# Patient Record
Sex: Male | Born: 1945
Health system: Southern US, Community
[De-identification: ages and names within clinical notes are randomized; demographics above are authoritative.]

## PROBLEM LIST (undated history)

## (undated) DIAGNOSIS — I6350 Cerebral infarction due to unspecified occlusion or stenosis of unspecified cerebral artery: Secondary | ICD-10-CM

## (undated) DIAGNOSIS — K219 Gastro-esophageal reflux disease without esophagitis: Secondary | ICD-10-CM

## (undated) DIAGNOSIS — G629 Polyneuropathy, unspecified: Secondary | ICD-10-CM

## (undated) DIAGNOSIS — D869 Sarcoidosis, unspecified: Secondary | ICD-10-CM

## (undated) DIAGNOSIS — I10 Essential (primary) hypertension: Secondary | ICD-10-CM

## (undated) DIAGNOSIS — R55 Syncope and collapse: Secondary | ICD-10-CM

## (undated) DIAGNOSIS — M1990 Unspecified osteoarthritis, unspecified site: Secondary | ICD-10-CM

## (undated) DIAGNOSIS — E785 Hyperlipidemia, unspecified: Secondary | ICD-10-CM

## (undated) DIAGNOSIS — K4090 Unilateral inguinal hernia, without obstruction or gangrene, not specified as recurrent: Secondary | ICD-10-CM

## (undated) DIAGNOSIS — Z8679 Personal history of other diseases of the circulatory system: Secondary | ICD-10-CM

## (undated) DIAGNOSIS — C4490 Unspecified malignant neoplasm of skin, unspecified: Secondary | ICD-10-CM

## (undated) HISTORY — PX: HERNIA REPAIR: SHX51

## (undated) HISTORY — PX: CATARACT EXTRACTION: SUR2

## (undated) HISTORY — PX: APPENDECTOMY: SHX54

## (undated) HISTORY — DX: Polyneuropathy, unspecified: G62.9

## (undated) HISTORY — DX: Syncope and collapse: R55

## (undated) HISTORY — DX: Hyperlipidemia, unspecified: E78.5

## (undated) HISTORY — DX: Personal history of other diseases of the circulatory system: Z86.79

## (undated) HISTORY — DX: Unilateral inguinal hernia, without obstruction or gangrene, not specified as recurrent: K40.90

## (undated) HISTORY — DX: Cerebral infarction due to unspecified occlusion or stenosis of unspecified cerebral artery: I63.50

## (undated) HISTORY — PX: COLONOSCOPY: SHX174

## (undated) HISTORY — DX: Sarcoidosis, unspecified: D86.9

## (undated) HISTORY — DX: Gastro-esophageal reflux disease without esophagitis: K21.9

## (undated) HISTORY — DX: Essential (primary) hypertension: I10

---

## 2000-01-31 ENCOUNTER — Ambulatory Visit (HOSPITAL_COMMUNITY): Admission: RE | Admit: 2000-01-31 | Discharge: 2000-01-31 | Payer: Self-pay | Admitting: Cardiology

## 2006-03-24 ENCOUNTER — Ambulatory Visit: Payer: Self-pay | Admitting: Internal Medicine

## 2006-04-07 ENCOUNTER — Ambulatory Visit: Payer: Self-pay | Admitting: Internal Medicine

## 2006-04-07 ENCOUNTER — Encounter (INDEPENDENT_AMBULATORY_CARE_PROVIDER_SITE_OTHER): Payer: Self-pay | Admitting: Specialist

## 2007-11-19 ENCOUNTER — Observation Stay (HOSPITAL_COMMUNITY): Admission: EM | Admit: 2007-11-19 | Discharge: 2007-11-21 | Payer: Self-pay | Admitting: Emergency Medicine

## 2007-11-19 ENCOUNTER — Ambulatory Visit: Payer: Self-pay | Admitting: Cardiology

## 2008-05-19 ENCOUNTER — Ambulatory Visit: Payer: Self-pay | Admitting: Internal Medicine

## 2008-05-19 DIAGNOSIS — R1032 Left lower quadrant pain: Secondary | ICD-10-CM | POA: Insufficient documentation

## 2008-05-19 DIAGNOSIS — K219 Gastro-esophageal reflux disease without esophagitis: Secondary | ICD-10-CM

## 2008-05-19 HISTORY — DX: Gastro-esophageal reflux disease without esophagitis: K21.9

## 2008-05-19 LAB — CONVERTED CEMR LAB: Creatinine, Ser: 1.1 mg/dL (ref 0.4–1.5)

## 2008-05-22 ENCOUNTER — Ambulatory Visit: Payer: Self-pay | Admitting: Internal Medicine

## 2008-05-29 DIAGNOSIS — K4090 Unilateral inguinal hernia, without obstruction or gangrene, not specified as recurrent: Secondary | ICD-10-CM

## 2008-05-29 HISTORY — DX: Unilateral inguinal hernia, without obstruction or gangrene, not specified as recurrent: K40.90

## 2008-06-02 DIAGNOSIS — R930 Abnormal findings on diagnostic imaging of skull and head, not elsewhere classified: Secondary | ICD-10-CM | POA: Insufficient documentation

## 2008-06-09 ENCOUNTER — Encounter: Admission: RE | Admit: 2008-06-09 | Discharge: 2008-06-09 | Payer: Self-pay | Admitting: General Surgery

## 2008-06-16 ENCOUNTER — Encounter (INDEPENDENT_AMBULATORY_CARE_PROVIDER_SITE_OTHER): Payer: Self-pay | Admitting: *Deleted

## 2008-07-09 ENCOUNTER — Ambulatory Visit (HOSPITAL_COMMUNITY): Admission: RE | Admit: 2008-07-09 | Discharge: 2008-07-09 | Payer: Self-pay | Admitting: General Surgery

## 2008-07-24 ENCOUNTER — Encounter: Payer: Self-pay | Admitting: Internal Medicine

## 2009-04-08 ENCOUNTER — Encounter (INDEPENDENT_AMBULATORY_CARE_PROVIDER_SITE_OTHER): Payer: Self-pay | Admitting: *Deleted

## 2009-09-18 ENCOUNTER — Telehealth: Payer: Self-pay | Admitting: Internal Medicine

## 2010-08-31 NOTE — Progress Notes (Signed)
Summary: Schedule Colonoscopy  Phone Note Outgoing Call   Call placed by: Hortense Ramal CMA Duncan Dull),  September 18, 2009 10:17 AM Call placed to: Patient Summary of Call: Patient is due for a recall colonoscopy due to his history of diverticulosis, internal hemorrhoids and adenomatous colonic polyps. I have advised patient of this and states that he does not want to have a colonosocpy at this time due to some other things going on. He states that he will call back. I have advised the patient as to the importance of having f/u colonoscopy. Patient verbalizes understanding. Initial call taken by: Hortense Ramal CMA Duncan Dull),  September 18, 2009 10:19 AM

## 2010-12-14 NOTE — H&P (Signed)
NAMEMATEUS, REWERTS                   ACCOUNT NO.:  [id number]   MEDICAL RECORD NO.:  [id number]          PATIENT TYPE:  INP   LOCATION:  6533                         FACILITY:  [facility]   PHYSICIAN:  Rollene Rotunda, MD, FACCDATE OF BIRTH:  07/28/1946   DATE OF ADMISSION:  11/19/2007  DATE OF DISCHARGE:                              HISTORY & PHYSICAL   PRIMARY:  Gaspar Garbe, MD.   REFERRING:  [facility] [address].   REASON FOR PRESENTATION:  Evaluate the patient for chest pain.   HISTORY OF PRESENT ILLNESS:  The patient is a pleasant 65 year old  gentleman with no prior cardiac history.  He does report a history a few  years ago of an abnormal EKG with a right bundle branch block.  He had a  stress perfusion study per his description.  He thinks that this was  normal.  There was no cardiology follow-up.  He has no active  cardiovascular physicians.  We were asked to see him upon transfer from  [facility].  He reports that yesterday he had chest pain.  This happened  at rest.  He just picked up his 12-pound dog.  He described as sharp  pain.  It radiated through to his back.  It was severe lasting for about  30 seconds.  It went away spontaneously.  He said that there was no  associated diaphoresis, nausea or vomiting.  He had not had this kind of  discomfort before.  The patient had a recurrence of this discomfort  today around 6:00 p.m..  It was at rest.  It was substernal and radiated  through to his back.  He felt like he had to burp and had a little  discomfort up into his throat.  It was mildly diaphoretic.  He did not  have any acute shortness of breath.  He did not have any radiation to  his shoulders or to his arms.  He was slightly nauseated but did not  throw up.  This discomfort was not as severe as yesterday but lasted a  few more minutes.  It seemed to go away spontaneously though he has had  some hints of it coming back since he has been in the Urgent Care and  in  the ER.  At Urgent Care, there were no acute EKG changes.  He did get  aspirin and was transferred here.  Of note, his blood pressure initially  was 170/100.  Blood pressure here currently are 140/90.  He does not  report out of control blood pressure though he has had hypertension for  a couple of years.   Of note, the patient took a long car trip driving back from [place]  yesterday.  He has had no leg pain or swelling.  He has been active.  He  is able to mow the lawn this weekend with a push mower.  He had not done  that in a while.  He said he did have to stop twice because of fatigue.  He said he has been more fatigued recently over the  last several weeks.  He is not describing PND or orthopnea.   PAST HISTORY:  1. Hypertension x2 years.  2. Hyperlipidemia x2 years.   PAST SURGICAL HISTORY:  Appendectomy.   ALLERGIES:  None.   MEDICATIONS:  (The patient is not sure.  He thinks he takes simvastatin  though he does not know the dose.  He takes a blood pressure medicine  but does not remember what it is).   FAMILY HISTORY:  Noncontributory for early coronary artery disease.   SOCIAL HISTORY:  The patient quit smoking greater than 20 years ago.  He  is married.  He has two children of his own and two step children.  He  works in [employer].   REVIEW OF SYSTEMS:  As stated in the HPI, positive for hemorrhoids but  no active bleeding.  Negative for all other systems.   PHYSICAL EXAMINATION:  GENERAL:  The patient is in no acute distress.  VITAL SIGNS:  Blood pressure (initially on presentation in the ER  160/98), heart rate 70, 99% saturation on 2 L, afebrile.  HEENT:  Eyes, pupils equal, round, and reactive to light, fundi not  visualized, oral mucosa unremarkable.  NECK:  No jugular distention of 45 degrees, carotid upstroke brisk and  symmetric, no bruits, no thyromegaly.  LYMPHATICS:  No cervical, axillary, or inguinal adenopathy.  LUNGS:  Clear to auscultation  bilaterally.  BACK:  No costovertebral angle tenderness.  CHEST:  Unremarkable.  HEART:  PMI not displaced or sustained, S1 and S2 within normal limits,  no S3, no S4, no clicks, no rubs, no murmurs.  ABDOMEN:  Flat, positive  bowel sounds, normal in frequency and pitch, no bruits, no rebound, no  guarding, no midline pulsatile mass, no hepatomegaly, no splenomegaly.  SKIN:  No rashes, no nodules.  EXTREMITIES:  Pulses 2+ throughout, no edema, no cyanosis or clubbing.  NEURO:  Oriented to person, place, and time, cranial nerves II through  XII grossly intact, motor is intact throughout.   LABS:  EKG, sinus rhythm, rate 70, axis within normal limits, intervals  within normal limits, no acute ST-T wave changes.   ASSESSMENT/PLAN:  1. Chest,  the patient's chest discomfort has some atypical features.      There are no acute EKG changes.  Enzymes and other labs are      pending.  Chest x-ray is pending.  He does have cardiovascular risk      factors.  At this point, I will plan to rule him out from      myocardial infarction.  I will treat him with aspirin, beta      blockers, heparin.  I will get nitroglycerin if he has recurrent      discomfort.  If he rules out and his EKG in the morning is      unremarkable,  I will plan an inpatient exercise Cardiolite.      Because he took a long car trip, I will check a D-dimer.  If this      is positive, he will need a spiral CT.  If the above workup is      negative, I might suggest gastrointestinal evaluation.  2. Hypertension.  Blood pressure was elevated at Urgent Care and here.      I plan on using a beta blocker.  His wife will bring his home      medications and we will probably restart those prior to discharge.  3. Risk reduction.  We will  check a lipid profile.  I will check a      TSH.  He will be counseled on weight.      Rollene Rotunda, MD, [facility]  Electronically Signed     JH/MEDQ  D:  11/19/2007  T:  11/20/2007  Job:   [id number]   cc:   Gaspar Garbe, M.D.

## 2010-12-14 NOTE — Op Note (Signed)
[name], [name]                ACCOUNT NO.:  [id number]   MEDICAL RECORD NO.:  [id number]          PATIENT TYPE:  AMB   LOCATION:  DAY                          FACILITY:  [facility]   PHYSICIAN:  Sharlet Salina T. Hoxworth, M.D.DATE OF BIRTH:  Oct 13, 1945   DATE OF PROCEDURE:  07/09/2008  DATE OF DISCHARGE:                               OPERATIVE REPORT   PREOPERATIVE DIAGNOSIS:  Left inguinal hernia.   POSTOPERATIVE DIAGNOSIS:  Left inguinal hernia.   SURGICAL PROCEDURES:  Laparoscopic repair of left inguinal hernia.   SURGEON:  Lorne Skeens. Hoxworth, M.D.   ANESTHESIA:  General.   BRIEF HISTORY:  [name] is a 65 year old male who presents with a very  symptomatic left inguinal hernia confirmed on exam and on CT scan after  an episode abdominal pain and has been shown to contain sigmoid colon.  I have recommended repair.  We discussed options including open  laparoscopic repair and have decided to proceed with laparoscopic  repair.  The nature of the procedure, its indications and risks of  bleeding, infection, recurrence, anesthetic complications and rare risks  of bladder or visceral injury were discussed and understood.  He is wow  brought to operating room for this procedure.   DESCRIPTION OF OPERATION:  The patient brought to the operating room,  placed in supine position on the operating table and general orotracheal  anesthesia was induced.  The abdomen was widely sterilely prepped and  draped.  Foley catheter was placed.  He received preoperative  antibiotics.  PAS were in place.  Correct patient and procedure were  verified.  Local anesthesia was used to infiltrate the trocar sites.  A  1 cm incision was made at the umbilicus and dissection carried down to  the anterior fascia.  This was incised transversely just to the left the  midline and the medial edge of the left rectus muscle identified and  retracted laterally and the preperitoneal space entered under direct  vision.  The balloon catheter was then passed with its tip into this  space and then down along the midline to the pubic symphysis.  Under  laparoscopic vision, the balloon was inflated.  There was preferential  inflation over to the left side.  It was left inflated for several  minutes for hemostasis and then the balloon desufflated and removed and  CO2 pressure applied via the balloon trocar.  Laparoscopy showed  excellent dissection of the left, minimal dissection over to the right  side just beyond the midline.  The patient had no clinical evidence of  hernia on the right and I therefore just dissected the fascia and  peritoneum somewhat to the right of the midline to allow trocar  placement.  Two 5 mm trocars were then placed, one just beneath the  camera in the midline, another just over to the right of the midline  between the pubis and the umbilicus.  Again there had been excellent  dissection on the left side.  The pubic symphysis was identified and  Cooper's ligament cleared a little bit further down to the iliac vessels  which  were carefully identified and protected.  There was no direct  hernia.  The epigastric vessels were identified.  Immediately apparent  was a good-sized hernia sac and indirect hernia lateral to the  epigastric vessels.  There had been complete dissection of the  peritoneum away from the anterior abdominal wall laterally out to the  anterior superior iliac spine up to the level of the umbilicus.  The sac  was then dissected up near the internal ring and completely separated  from cord structures and a good-sized hernia sac and large cord lipoma  were reduced from the internal ring.  The peritoneum was then stripped  back well posterior all along the cord and was already dissected out  laterally.  An extra large Bard 3-D left-sided piece of mesh was then  inserted, unfurled and oriented.  It was tacked initially at the pubic  symphysis and then the lateral  corner was seen to go well out toward the  lateral abdominal wall and beginning along the superior edge where I  could feel the tacks through the anterior abdominal wall, the superior  edge was tacked back working medially avoiding the epigastric vessels  and finally the medial edge of the mesh was tacked just beyond the mid  midline and a couple of tacks placed in Cooper's ligament as well.  This  appeared to provide nice broad coverage of the direct and indirect  spaces.  The large hernia sac was then brought deep to the mesh and  tacked up to the abdominal wall again being able to feel the tack  through the abdominal wall.  The operative site was inspected for  hemostasis which appeared complete.  Trocars were removed and all CO2  evacuated.  The small fascial defect at the umbilicus was repaired with  a figure-of-eight suture of 0 Vicryl.  Skin was closed with subcuticular  Monocryl and Dermabond.  Sponge, needle and instrument counts correct.  The patient was taken to recovery in good condition.      Lorne Skeens. Hoxworth, M.D.  Electronically Signed     BTH/MEDQ  D:  07/09/2008  T:  07/10/2008  Job:  [id number]

## 2010-12-14 NOTE — Discharge Summary (Signed)
NAMEMOHANNAD, OLIVERO                   ACCOUNT NO.:  [id number]   MEDICAL RECORD NO.:  [id number]          PATIENT TYPE:  INP   LOCATION:  6533                         FACILITY:  [facility]   PHYSICIAN:  Veverly Fells. Excell Seltzer, MD  DATE OF BIRTH:  1945-09-03   DATE OF ADMISSION:  11/19/2007  DATE OF DISCHARGE:  11/21/2007                               DISCHARGE SUMMARY   PRIMARY CARDIOLOGIST:  Rollene Rotunda, MD, [facility].   PRIMARY CARE Gene Colee:  Gaspar Garbe, M.D.   DISCHARGE DIAGNOSIS:  Chest pain.   SECONDARY DIAGNOSES:  1. Hypertension/hypertensive urgency.  2. Hyperlipidemia.  3. Mediastinal adenopathy with multiple lung nodules noted on chest CT      this admission with recommended followup in 6 months.  4. Status post appendectomy.   ALLERGIES:  No known drug allergies.   PROCEDURES:  CT angio of the chest and Lexiscan Myoview showing an EF of  59% with normal LV wall motion, mild diaphragmatic attenuation, and  minimal nonreversible diminished myocardial perfusion at anterolateral  aspect of the left ventricular apex.  Overall felt to be a low-risk  study.   HISTORY OF PRESENT ILLNESS:  A 65 year old male with prior history of  hypertension and hyperlipidemia, who was in his usual state of health  until 1 day prior to admission when he had sudden onset of sharp chest  discomfort after picking up his dog radiating through to his back and  lasting for about 30 seconds.  Symptoms resolved spontaneously.  On  November 19, 2007, at approximately 6:00 p.m., he had recurrence of  symptoms while at rest associated with discomfort in his throat and a  sensation that he had to belch.  He presented to [facility], where ECG  showed no acute changes.  His blood pressure was elevated at Urgent Care  at 170/100.  He was transferred to the [facility] ED for further  evaluation.  Upon arrival, his blood pressure was 140/90.  The patient  had no additional chest discomfort, and he was admitted for  further  evaluation.   HOSPITAL COURSE:  The patient ruled out for MI.  His D-dimer was  elevated at 0.97, and the patient did report a recent long trip from [place].  This being the case, a CT of the chest was performed, and  although it did not show pulmonary embolism he was found to have  mediastinal adenopathy and multiple lung nodules with recommendation for  a followup CT in approximately 6 months.  From a cardiac standpoint, Mr.  Puryear had no additional chest discomfort, and his blood pressure did  improve with resumption of his home dose of benazepril as well as the  addition of Norvasc.  The patient was planned to undergo stress testing  on November 20, 2007, however, the blood pressure was elevated that  morning, and he was subsequently rescheduled for a [employer],  which took place this morning.  As noted above, Myoview imaging revealed  a normal LV function with mild diaphragmatic attenuation, and overall it  was felt to be a low-risk  study.  Mr. Rufus will be discharged home  today in good condition.   Mr. Kuch TSH was found to be high at 6.4 during this admission.  We  have recommended reevaluation of his TSH and free T4 in the outpatient  setting, and we have advised that he follow up with Dr. Wylene Simmer in the  next 1-2 weeks.   DISCHARGE LABS:  Hemoglobin 14.5, hematocrit 42.4, WBC 6.9, platelets  174,000, D-dimer 0.97.  Sodium 139, potassium 3.8, chloride 107, CO2 of  24, BUN 24, creatinine 1.15, glucose 97, total bilirubin 0.8, alkaline  phosphatase 66, AST 21, ALT 20, total protein 6.4, albumin 3.4, calcium  8.8, CK 83, MB 0.8, troponin-I 0.01, total cholesterol 172,  triglycerides 69, HDL 51, LDL 107, TSH 6.444.   DISPOSITION:  The patient is being discharged home today in good  condition.   FOLLOWUP PLANS AND APPOINTMENTS:  The patient is asked to follow up with  Dr. Wylene Simmer in the next 1-2 weeks for further evaluation of thyroid  function.  The  patient will also require repeat CT of the chest to  evaluate lung nodules and mediastinal lymphadenopathy.   DISCHARGE MEDICATIONS:  1. Aspirin 81 mg daily.  2. Benazepril 20 mg daily.  3. Norvasc 5 mg daily.  4. Simvastatin 80 mg half tablet nightly.  5. Temazepam 15 mg q.h.s. p.r.n.   OUTSTANDING LABORATORY STUDIES:  None.   DURATION OF DISCHARGE/ENCOUNTER:  40 minutes including physician time.      Nicolasa Ducking, ANP      Veverly Fells. Excell Seltzer, MD  Electronically Signed    CB/MEDQ  D:  11/21/2007  T:  11/22/2007  Job:  [id number]   cc:   Gaspar Garbe, M.D.

## 2011-04-26 LAB — DIFFERENTIAL
Basophils Absolute: 0
Basophils Relative: 1
Eosinophils Absolute: 0.4
Eosinophils Absolute: 0.4
Eosinophils Relative: 6 — ABNORMAL HIGH
Lymphocytes Relative: 21
Lymphs Abs: 1.6
Lymphs Abs: 1.6
Monocytes Absolute: 0.9
Monocytes Absolute: 0.9
Monocytes Relative: 11
Monocytes Relative: 12
Neutro Abs: 4.8
Neutrophils Relative %: 61
Neutrophils Relative %: 62

## 2011-04-26 LAB — CBC
HCT: 42.4
HCT: 42.4
Hemoglobin: 14.3
Hemoglobin: 14.3
MCHC: 33.8
MCHC: 33.8
MCV: 93
MCV: 93.1
MCV: 93.5
MCV: 93.7
Platelets: 174
Platelets: 186
Platelets: 198
Platelets: 203
RBC: 4.54
RBC: 4.56
RDW: 13
RDW: 13
RDW: 13.2
WBC: 6.9
WBC: 7.7
WBC: 7.7

## 2011-04-26 LAB — LIPID PANEL
Cholesterol: 172
HDL: 51
Triglycerides: 69

## 2011-04-26 LAB — HEPATIC FUNCTION PANEL
ALT: 20
AST: 21
Albumin: 3.4 — ABNORMAL LOW
Alkaline Phosphatase: 66
Bilirubin, Direct: <0.1
Total Bilirubin: 0.8
Total Protein: 6.4

## 2011-04-26 LAB — TSH: TSH: 6.444 — ABNORMAL HIGH

## 2011-04-26 LAB — POCT CARDIAC MARKERS
Operator id: 151321
Troponin i, poc: <0.05

## 2011-04-26 LAB — HEPARIN LEVEL (UNFRACTIONATED)
Heparin Unfractionated: 0.83 — ABNORMAL HIGH
Heparin Unfractionated: 0.98 — ABNORMAL HIGH
Heparin Unfractionated: 1.19 — ABNORMAL HIGH

## 2011-04-26 LAB — CARDIAC PANEL(CRET KIN+CKTOT+MB+TROPI)
CK, MB: 1.2
Relative Index: INVALID
Total CK: 83

## 2011-04-26 LAB — CK TOTAL AND CKMB (NOT AT ARMC): CK, MB: 1.3

## 2011-04-26 LAB — BASIC METABOLIC PANEL
BUN: 24 — ABNORMAL HIGH
CO2: 24
Calcium: 8.8
Chloride: 107
Creatinine, Ser: 1.15
GFR calc Af Amer: >60
GFR calc non Af Amer: >60
Glucose, Bld: 97
Potassium: 3.8
Sodium: 139

## 2011-04-26 LAB — PROTIME-INR
INR: 0.9
Prothrombin Time: 12.8

## 2011-05-06 LAB — DIFFERENTIAL
Eosinophils Absolute: 0.4 10*3/uL (ref 0.0–0.7)
Eosinophils Relative: 5 % (ref 0–5)
Lymphocytes Relative: 22 % (ref 12–46)
Lymphs Abs: 1.7 10*3/uL (ref 0.7–4.0)
Monocytes Relative: 10 % (ref 3–12)

## 2011-05-06 LAB — COMPREHENSIVE METABOLIC PANEL
ALT: 27 U/L (ref 0–53)
AST: 26 U/L (ref 0–37)
Albumin: 3.5 g/dL (ref 3.5–5.2)
CO2: 28 mEq/L (ref 19–32)
Calcium: 9.2 mg/dL (ref 8.4–10.5)
Creatinine, Ser: 1.1 mg/dL (ref 0.4–1.5)
GFR calc Af Amer: >60 mL/min (ref >60)
GFR calc non Af Amer: >60 mL/min (ref >60)
Sodium: 140 mEq/L (ref 135–145)
Total Protein: 6.7 g/dL (ref 6.0–8.3)

## 2011-05-06 LAB — URINALYSIS, ROUTINE W REFLEX MICROSCOPIC
Bilirubin Urine: NEGATIVE
Hgb urine dipstick: NEGATIVE
Nitrite: NEGATIVE
Protein, ur: NEGATIVE mg/dL
Specific Gravity, Urine: 1.014 (ref 1.005–1.030)
Urobilinogen, UA: 0.2 mg/dL (ref 0.0–1.0)

## 2011-05-06 LAB — CBC
MCHC: 33.7 g/dL (ref 30.0–36.0)
MCV: 92.9 fL (ref 78.0–100.0)
Platelets: 204 10*3/uL (ref 150–400)
RBC: 4.91 MIL/uL (ref 4.22–5.81)
RDW: 13.4 % (ref 11.5–15.5)

## 2012-08-17 ENCOUNTER — Other Ambulatory Visit: Payer: Self-pay | Admitting: Dermatology

## 2012-09-19 ENCOUNTER — Other Ambulatory Visit: Payer: Self-pay | Admitting: Dermatology

## 2012-09-26 ENCOUNTER — Other Ambulatory Visit: Payer: Self-pay | Admitting: Dermatology

## 2012-10-25 ENCOUNTER — Other Ambulatory Visit: Payer: Self-pay | Admitting: Dermatology

## 2014-12-31 HISTORY — PX: KNEE SURGERY: SHX244

## 2014-12-31 HISTORY — PX: SKIN CANCER EXCISION: SHX779

## 2015-04-02 ENCOUNTER — Inpatient Hospital Stay (HOSPITAL_COMMUNITY): Payer: Medicare HMO

## 2015-04-02 ENCOUNTER — Inpatient Hospital Stay (HOSPITAL_COMMUNITY)
Admission: EM | Admit: 2015-04-02 | Discharge: 2015-04-04 | DRG: 066 | Disposition: A | Discharge status: Home / Self Care | Payer: Medicare HMO | Attending: Internal Medicine | Admitting: Internal Medicine

## 2015-04-02 ENCOUNTER — Emergency Department (HOSPITAL_COMMUNITY): Payer: Medicare HMO

## 2015-04-02 ENCOUNTER — Other Ambulatory Visit (HOSPITAL_COMMUNITY): Payer: Medicare HMO

## 2015-04-02 ENCOUNTER — Encounter (HOSPITAL_COMMUNITY): Payer: Self-pay

## 2015-04-02 DIAGNOSIS — H5347 Heteronymous bilateral field defects: Secondary | ICD-10-CM | POA: Diagnosis present

## 2015-04-02 DIAGNOSIS — I639 Cerebral infarction, unspecified: Secondary | ICD-10-CM | POA: Diagnosis present

## 2015-04-02 DIAGNOSIS — H5462 Unqualified visual loss, left eye, normal vision right eye: Secondary | ICD-10-CM | POA: Diagnosis present

## 2015-04-02 DIAGNOSIS — I63431 Cerebral infarction due to embolism of right posterior cerebral artery: Principal | ICD-10-CM | POA: Diagnosis present

## 2015-04-02 DIAGNOSIS — I6350 Cerebral infarction due to unspecified occlusion or stenosis of unspecified cerebral artery: Secondary | ICD-10-CM

## 2015-04-02 DIAGNOSIS — Z7902 Long term (current) use of antithrombotics/antiplatelets: Secondary | ICD-10-CM | POA: Diagnosis not present

## 2015-04-02 DIAGNOSIS — E785 Hyperlipidemia, unspecified: Secondary | ICD-10-CM | POA: Diagnosis present

## 2015-04-02 DIAGNOSIS — I10 Essential (primary) hypertension: Secondary | ICD-10-CM | POA: Insufficient documentation

## 2015-04-02 DIAGNOSIS — G43109 Migraine with aura, not intractable, without status migrainosus: Secondary | ICD-10-CM | POA: Diagnosis present

## 2015-04-02 DIAGNOSIS — I63411 Cerebral infarction due to embolism of right middle cerebral artery: Secondary | ICD-10-CM | POA: Diagnosis present

## 2015-04-02 DIAGNOSIS — R270 Ataxia, unspecified: Secondary | ICD-10-CM | POA: Diagnosis present

## 2015-04-02 DIAGNOSIS — I6789 Other cerebrovascular disease: Secondary | ICD-10-CM | POA: Diagnosis not present

## 2015-04-02 DIAGNOSIS — Z79899 Other long term (current) drug therapy: Secondary | ICD-10-CM | POA: Diagnosis not present

## 2015-04-02 DIAGNOSIS — Z7982 Long term (current) use of aspirin: Secondary | ICD-10-CM

## 2015-04-02 DIAGNOSIS — I6340 Cerebral infarction due to embolism of unspecified cerebral artery: Secondary | ICD-10-CM | POA: Diagnosis not present

## 2015-04-02 HISTORY — DX: Cerebral infarction due to unspecified occlusion or stenosis of unspecified cerebral artery: I63.50

## 2015-04-02 HISTORY — DX: Unspecified osteoarthritis, unspecified site: M19.90

## 2015-04-02 HISTORY — DX: Unspecified malignant neoplasm of skin, unspecified: C44.90

## 2015-04-02 LAB — BASIC METABOLIC PANEL
ANION GAP: 7 (ref 5–15)
BUN: 20 mg/dL (ref 6–20)
CO2: 27 mmol/L (ref 22–32)
Calcium: 9.9 mg/dL (ref 8.9–10.3)
Chloride: 104 mmol/L (ref 101–111)
Creatinine, Ser: 1.46 mg/dL — ABNORMAL HIGH (ref 0.61–1.24)
GFR calc Af Amer: 55 mL/min — ABNORMAL LOW (ref >60)
GFR, EST NON AFRICAN AMERICAN: 47 mL/min — AB (ref >60)
GLUCOSE: 99 mg/dL (ref 65–99)
POTASSIUM: 4 mmol/L (ref 3.5–5.1)
Sodium: 138 mmol/L (ref 135–145)

## 2015-04-02 LAB — CBC WITH DIFFERENTIAL/PLATELET
BASOS ABS: 0.1 10*3/uL (ref 0.0–0.1)
Basophils Relative: 1 % (ref 0–1)
Eosinophils Absolute: 0.5 10*3/uL (ref 0.0–0.7)
Eosinophils Relative: 6 % — ABNORMAL HIGH (ref 0–5)
HEMATOCRIT: 48.1 % (ref 39.0–52.0)
HEMOGLOBIN: 16.3 g/dL (ref 13.0–17.0)
LYMPHS PCT: 31 % (ref 12–46)
Lymphs Abs: 2.5 10*3/uL (ref 0.7–4.0)
MCH: 31.9 pg (ref 26.0–34.0)
MCHC: 33.9 g/dL (ref 30.0–36.0)
MCV: 94.1 fL (ref 78.0–100.0)
Monocytes Absolute: 0.8 10*3/uL (ref 0.1–1.0)
Monocytes Relative: 11 % (ref 3–12)
NEUTROS ABS: 4.1 10*3/uL (ref 1.7–7.7)
NEUTROS PCT: 51 % (ref 43–77)
PLATELETS: 235 10*3/uL (ref 150–400)
RBC: 5.11 MIL/uL (ref 4.22–5.81)
RDW: 13.1 % (ref 11.5–15.5)
WBC: 7.9 10*3/uL (ref 4.0–10.5)

## 2015-04-02 LAB — I-STAT TROPONIN, ED: Troponin i, poc: 0 ng/mL (ref 0.00–0.08)

## 2015-04-02 LAB — PROTIME-INR
INR: 1.03 (ref 0.00–1.49)
PROTHROMBIN TIME: 13.7 s (ref 11.6–15.2)

## 2015-04-02 MED ORDER — STROKE: EARLY STAGES OF RECOVERY BOOK
Freq: Once | Status: DC
  Filled 2015-04-02: qty 1

## 2015-04-02 MED ORDER — CLOPIDOGREL BISULFATE 75 MG PO TABS
75.0000 mg | ORAL_TABLET | Freq: Every day | ORAL | Status: DC
  Administered 2015-04-02: 75 mg via ORAL
  Filled 2015-04-02: qty 1

## 2015-04-02 MED ORDER — ACETAMINOPHEN 325 MG PO TABS
650.0000 mg | ORAL_TABLET | Freq: Four times a day (QID) | ORAL | Status: DC | PRN
  Administered 2015-04-02 – 2015-04-04 (×4): 650 mg via ORAL
  Filled 2015-04-02 (×4): qty 2

## 2015-04-02 MED ORDER — ACETAMINOPHEN 325 MG PO TABS
650.0000 mg | ORAL_TABLET | Freq: Once | ORAL | Status: AC
  Administered 2015-04-02: 650 mg via ORAL
  Filled 2015-04-02: qty 2

## 2015-04-02 MED ORDER — SENNOSIDES-DOCUSATE SODIUM 8.6-50 MG PO TABS: 1.0000 | ORAL_TABLET | Freq: Every evening | ORAL | Status: DC | PRN

## 2015-04-02 MED ORDER — FLUTICASONE PROPIONATE 50 MCG/ACT NA SUSP
1.0000 | Freq: Every day | NASAL | Status: DC | PRN
  Filled 2015-04-02: qty 16

## 2015-04-02 MED ORDER — CLOPIDOGREL BISULFATE 75 MG PO TABS
75.0000 mg | ORAL_TABLET | Freq: Every day | ORAL | Status: DC
  Administered 2015-04-03 – 2015-04-04 (×2): 75 mg via ORAL
  Filled 2015-04-02 (×2): qty 1

## 2015-04-02 MED ORDER — ENOXAPARIN SODIUM 40 MG/0.4ML ~~LOC~~ SOLN
40.0000 mg | SUBCUTANEOUS | Status: DC
  Administered 2015-04-02 – 2015-04-03 (×2): 40 mg via SUBCUTANEOUS
  Filled 2015-04-02: qty 0.4

## 2015-04-02 MED ORDER — ATORVASTATIN CALCIUM 80 MG PO TABS
80.0000 mg | ORAL_TABLET | Freq: Every day | ORAL | Status: DC
  Administered 2015-04-03 – 2015-04-04 (×2): 80 mg via ORAL
  Filled 2015-04-02 (×2): qty 1

## 2015-04-02 NOTE — Consult Note (Signed)
Referring Physician: ED    Chief Complaint: visual loss left eye, right arm numbness, unsteadiness, HA, outside MRI brain showing stroke  HPI:                                                                                                                                         [name] is an 69 y.o. male, right handed, without pertinent past medical history, comes in for evaluation of the above stated symptoms. He indicated that he has been experiencing vision changes in both eyes for a couple of weeks and HA, but yesterday got worse and he saw his primary care physician who ordered a brain MRI that was positive for stroke. His physician advised him to start taking plavix (said that he has been taking aspirin 81 daily for quite some time) and ordered several tests that be completed as outpatient. He comes today stating that today he had " new symptoms" of transient visual loss left eye as well as numbness right had, pain behind the right eye, and imbalance. Denies vertigo, focal weakness, slurred speech, confusion, or language impairment. Outside MRI brain was personally reviewed and showed a right PCA distribution infarct involving right occipital lobe and portions of the right posterior temporal lobe.   Date last known well: unable to the determine Time last known well: unable to deteermine tPA Given: no, out of the window   History reviewed. No pertinent past medical history.  History reviewed. No pertinent past surgical history.  History reviewed. No pertinent family history. Social History:  reports that he has never smoked. He has never used smokeless tobacco. His alcohol and drug histories are not on file.  Allergies: No Known Allergies  Medications:                                                                                                                           I have reviewed the patient's current medications.  ROS:  History obtained from wife, chart review and the patient  General ROS: negative for - chills, fatigue, fever, night sweats, weight gain or weight loss Psychological ROS: negative for - behavioral disorder, hallucinations, memory difficulties, mood swings or suicidal ideation Ophthalmic ROS: negative for - double vision ENT ROS: negative for - epistaxis, nasal discharge, oral lesions, sore throat, tinnitus or vertigo Allergy and Immunology ROS: negative for - hives or itchy/watery eyes Hematological and Lymphatic ROS: negative for - bleeding problems, bruising or swollen lymph nodes Endocrine ROS: negative for - galactorrhea, hair pattern changes, polydipsia/polyuria or temperature intolerance Respiratory ROS: negative for - cough, hemoptysis, shortness of breath or wheezing Cardiovascular ROS: significant for - chest pain but negative for dyspnea on exertion, edema or irregular heartbeat Gastrointestinal ROS: negative for - abdominal pain, diarrhea, hematemesis, nausea/vomiting or stool incontinence Genito-Urinary ROS: negative for - dysuria, hematuria, incontinence or urinary frequency/urgency Musculoskeletal ROS: negative for - joint swelling or muscular weakness Neurological ROS: as noted in HPI Dermatological ROS: negative for rash and skin lesion changes  Physical exam:  Constitutional: well developed, pleasant male in no apparent distress. Blood pressure 136/91, pulse 74, temperature 97.7 F (36.5 C), temperature source Oral, resp. rate 18, height 6' 2"  (1.88 m), weight 105.235 kg (232 lb), SpO2 98 %. Eyes: no jaundice or exophthalmos.  Head: normocephalic. Neck: supple, no bruits, no JVD. Cardiac: no murmurs. Lungs: clear. Abdomen: soft, no tender, no mass. Extremities: no edema, clubbing, or cyanosis.  Skin: no rash  Neurologic Examination:                                                                                                       General: Mental Status: Alert, oriented, thought content appropriate.  Speech fluent without evidence of aphasia.  Able to follow 3 step commands without difficulty. Cranial Nerves: II: Discs flat bilaterally; Visual fields cut left upper field, pupils equal, round, reactive to light and accommodation III,IV, VI: ptosis not present, extra-ocular motions intact bilaterally V,VII: smile symmetric, facial light touch sensation normal bilaterally VIII: hearing normal bilaterally IX,X: uvula rises symmetrically XI: bilateral shoulder shrug XII: midline tongue extension without atrophy or fasciculations Motor: Right : Upper extremity   5/5    Left:     Upper extremity   5/5  Lower extremity   5/5     Lower extremity   5/5 Tone and bulk:normal tone throughout; no atrophy noted Sensory: Pinprick and light touch intact throughout, bilaterally Deep Tendon Reflexes:  Right: Upper Extremity   Left: Upper extremity   biceps (C-5 to C-6) 2/4   biceps (C-5 to C-6) 2/4 tricep (C7) 2/4    triceps (C7) 2/4 Brachioradialis (C6) 2/4  Brachioradialis (C6) 2/4  Lower Extremity Lower Extremity  quadriceps (L-2 to L-4) 2/4   quadriceps (L-2 to L-4) 2/4 Achilles (S1) 2/4   Achilles (S1) 2/4  Plantars: Right: downgoing   Left: downgoing Cerebellar: normal finger-to-nose,  normal heel-to-shin test Gait:  No tested due to multiple leads    Results for orders placed or performed during the hospital encounter of 04/02/15 (  from the past 48 hour(s))  CBC with Differential/Platelet     Status: Abnormal   Collection Time: 04/02/15 12:47 PM  Result Value Ref Range   WBC 7.9 4.0 - 10.5 K/uL   RBC 5.11 4.22 - 5.81 MIL/uL   Hemoglobin 16.3 13.0 - 17.0 g/dL   HCT 48.1 39.0 - 52.0 %   MCV 94.1 78.0 - 100.0 fL   MCH 31.9 26.0 - 34.0 pg   MCHC 33.9 30.0 - 36.0 g/dL   RDW 13.1 11.5 - 15.5 %   Platelets 235 150 - 400 K/uL   Neutrophils Relative % 51 43 - 77 %    Neutro Abs 4.1 1.7 - 7.7 K/uL   Lymphocytes Relative 31 12 - 46 %   Lymphs Abs 2.5 0.7 - 4.0 K/uL   Monocytes Relative 11 3 - 12 %   Monocytes Absolute 0.8 0.1 - 1.0 K/uL   Eosinophils Relative 6 (H) 0 - 5 %   Eosinophils Absolute 0.5 0.0 - 0.7 K/uL   Basophils Relative 1 0 - 1 %   Basophils Absolute 0.1 0.0 - 0.1 K/uL  Basic metabolic panel     Status: Abnormal   Collection Time: 04/02/15 12:47 PM  Result Value Ref Range   Sodium 138 135 - 145 mmol/L   Potassium 4.0 3.5 - 5.1 mmol/L   Chloride 104 101 - 111 mmol/L   CO2 27 22 - 32 mmol/L   Glucose, Bld 99 65 - 99 mg/dL   BUN 20 6 - 20 mg/dL   Creatinine, Ser 1.46 (H) 0.61 - 1.24 mg/dL   Calcium 9.9 8.9 - 10.3 mg/dL   GFR calc non Af Amer 47 (L) >60 mL/min   GFR calc Af Amer 55 (L) >60 mL/min    Comment: (NOTE) The eGFR has been calculated using the CKD EPI equation. This calculation has not been validated in all clinical situations. eGFR's persistently <60 mL/min signify possible Chronic Kidney Disease.    Anion gap 7 5 - 15  Protime-INR     Status: None   Collection Time: 04/02/15 12:47 PM  Result Value Ref Range   Prothrombin Time 13.7 11.6 - 15.2 seconds   INR 1.03 0.00 - 1.49  I-stat troponin, ED     Status: None   Collection Time: 04/02/15  1:31 PM  Result Value Ref Range   Troponin i, poc 0.00 0.00 - 0.08 ng/mL   Comment 3            Comment: Due to the release kinetics of cTnI, a negative result within the first hours of the onset of symptoms does not rule out myocardial infarction with certainty. If myocardial infarction is still suspected, repeat the test at appropriate intervals.    Ct Head Wo Contrast  04/02/2015   CLINICAL DATA:  Vision loss and syncope the left arm numbness  EXAM: CT HEAD WITHOUT CONTRAST  TECHNIQUE: Contiguous axial images were obtained from the base of the skull through the vertex without intravenous contrast.  COMPARISON:  None.  FINDINGS: The ventricles are normal in size and  configuration. There is no intracranial mass, hemorrhage, extra-axial fluid collection, or midline shift. Gray-white compartments appear normal. No acute infarct evident. The bony calvarium appears intact. The mastoid air cells are clear. There is a retention cyst in the posterior left maxillary antrum.  IMPRESSION: Retention cyst in posterior left maxillary antrum. No intracranial mass, hemorrhage, or focal gray - white compartment lesions/acute appearing infarct.   Electronically Signed  By: Lowella Grip III M.D.   On: 04/02/2015 14:07    Assessment: 70 y.o. male with complains of left visual impairment, HA, right arm numbness, imbalance, and MRI brain performed yesterday at an outside facility that demonstrated a left PCA territory infarct . He is in he ED today concerned with worsening such symptoms. Extension of recent infarct versus new infarcts that could be embolic or due to focal atherosclerotic disease left PCA. Recommended admission to the hospital in order to complete stroke work up/ruled out new stroke.   Stroke Risk Factors - age  Plan: 1. HgbA1c, fasting lipid panel 2. MRI, MRA  of the brain without contrast 3. Echocardiogram 4. Carotid dopplers 5. Prophylactic therapy-plavix 6. Risk factor modification 7. Telemetry monitoring 8. Frequent neuro checks 9. PT/OT SLP  Dorian Pod, MD [facility] [phone]  04/02/2015, 2:13 PM

## 2015-04-02 NOTE — Progress Notes (Signed)
*  PRELIMINARY RESULTS* Echocardiogram 2D Echocardiogram has been performed.  Leavy Cella 04/02/2015, 4:24 PM

## 2015-04-02 NOTE — ED Notes (Signed)
Pt presents with 2 week h/o vision changes to both eyes.  Pt reports being seen at PCP, had MRI yesterday that showed ischemia, reports being dizzy with headache.  Pt reports today, vision change to l peripheral field.  Pt reports onset of numbness and tingling to L hand and fingers.

## 2015-04-02 NOTE — H&P (Signed)
[facility] History and Physical  VAN SEYMORE QMG:[id number] DOB: 1946-03-27 DOA: 04/02/2015  Referring physician:  PCP: No primary care provider on file.   Chief Complaint: Visual changes  HPI: [name] is a 69 y.o. male with no cystic and past medical history presenting to the emergency department with complaints of left visual loss. He experience visual changes to both eyes about a month ago that was associate with headaches. He had been at a craft fair at the time symptoms started and thought these were secondary to being outside in the heat. Since then he had been expressing visual changes intermittently for which his primary care physician ordered an MRI of brain. This revealed last PCA territory infarct. This morning he reported experiencing complete lost the vision in his left eye after stepping out of the shower, symptoms which are still present in the emergency department. He was seen and evaluated by neurology who recommended further workup with repeat MRI, transthoracic echocardiogram and carotid Dopplers.                                      Review of Systems:  Constitutional:  No weight loss, night sweats, Fevers, chills, fatigue.  HEENT:  No headaches, Difficulty swallowing,Tooth/dental problems,Sore throat,  No sneezing, itching, ear ache, nasal congestion, post nasal drip,  Cardio-vascular:  No chest pain, Orthopnea, PND, swelling in lower extremities, anasarca, dizziness, palpitations  GI:  No heartburn, indigestion, abdominal pain, nausea, vomiting, diarrhea, change in bowel habits, loss of appetite  Resp:  No shortness of breath with exertion or at rest. No excess mucus, no productive cough, No non-productive cough, No coughing up of blood.No change in color of mucus.No wheezing.No chest wall deformity  Skin:  no rash or lesions.  GU:  no dysuria, change in color of urine, no urgency or frequency. No flank pain.  Musculoskeletal:  No joint pain or  swelling. No decreased range of motion. No back pain.  Psych:  No change in mood or affect. No depression or anxiety. No memory loss.   History reviewed. No pertinent past medical history. History reviewed. No pertinent past surgical history. Social History:  reports that he has never smoked. He has never used smokeless tobacco. His alcohol and drug histories are not on file.  Allergies  Allergen Reactions  . Hydrocodone Itching    History reviewed. No pertinent family history.   Prior to Admission medications   Medication Sig Start Date End Date Taking? Authorizing Provider  aspirin 81 MG chewable tablet Chew 81 mg by mouth daily.   Yes Historical Provider, MD  atorvastatin (LIPITOR) 80 MG tablet Take 80 mg by mouth daily.   Yes Historical Provider, MD  clopidogrel (PLAVIX) 75 MG tablet Take 75 mg by mouth daily.   Yes Historical Provider, MD  fluticasone (FLONASE) 50 MCG/ACT nasal spray Place 1 spray into both nostrils daily as needed for allergies or rhinitis.   Yes Historical Provider, MD  hydrochlorothiazide (HYDRODIURIL) 25 MG tablet Take 25 mg by mouth daily.   Yes Historical Provider, MD  hydrocortisone (ANUSOL-HC) 2.5 % rectal cream Place 1 application rectally daily as needed for hemorrhoids or itching.   Yes Historical Provider, MD  ibuprofen (ADVIL,MOTRIN) 200 MG tablet Take 200 mg by mouth every 6 (six) hours as needed for fever or moderate pain.   Yes Historical Provider, MD  irbesartan (AVAPRO) 300 MG tablet Take 300  mg by mouth daily.   Yes Historical Provider, MD  levocetirizine (XYZAL) 5 MG tablet Take 5 mg by mouth every evening.   Yes Historical Provider, MD  temazepam (RESTORIL) 15 MG capsule Take 30 mg by mouth at bedtime as needed for sleep.   Yes Historical Provider, MD   Physical Exam: Filed Vitals:   04/02/15 1332 04/02/15 1416 04/02/15 1425 04/02/15 1430  BP: 136/91 134/82  128/81  Pulse: 74 71  70  Temp:   97.7 F (36.5 C)   TempSrc:      Resp: 18 17   22   Height:      Weight:      SpO2: 98% 98%  97%    Wt Readings from Last 3 Encounters:  04/02/15 105.235 kg (232 lb)  05/19/08 108.583 kg (239 lb 6.1 oz)    General:  Appears calm and comfortable, no acute distress. He is awake and alert Eyes: PERRL, normal lids, irises & conjunctiva ENT: grossly normal hearing, lips & tongue Neck: no LAD, masses or thyromegaly Cardiovascular: RRR, no m/r/g. No LE edema. Telemetry: SR, no arrhythmias  Respiratory: CTA bilaterally, no w/r/r. Normal respiratory effort. Abdomen: soft, ntnd Skin: no rash or induration seen on limited exam Musculoskeletal: grossly normal tone BUE/BLE Psychiatric: grossly normal mood and affect, speech fluent and appropriate Neurologic: Extraocular movement was intact in both eyes. There was no facial droop, tongue deviation or slurred speech. He had loss of vision over left peripheral field from his left eye. Vision intact in his right eye. Had 5 of 5 muscle strength to bilateral upper extremities and lower extremities with 2+ bilateral deep tendon reflexes.           Labs on Admission:  Basic Metabolic Panel:  Recent Labs Lab 04/02/15 1247  NA 138  K 4.0  CL 104  CO2 27  GLUCOSE 99  BUN 20  CREATININE 1.46*  CALCIUM 9.9   Liver Function Tests: No results for input(s): AST, ALT, ALKPHOS, BILITOT, PROT, ALBUMIN in the last 168 hours. No results for input(s): LIPASE, AMYLASE in the last 168 hours. No results for input(s): AMMONIA in the last 168 hours. CBC:  Recent Labs Lab 04/02/15 1247  WBC 7.9  NEUTROABS 4.1  HGB 16.3  HCT 48.1  MCV 94.1  PLT 235   Cardiac Enzymes: No results for input(s): CKTOTAL, CKMB, CKMBINDEX, TROPONINI in the last 168 hours.  BNP (last 3 results) No results for input(s): BNP in the last 8760 hours.  ProBNP (last 3 results) No results for input(s): PROBNP in the last 8760 hours.  CBG: No results for input(s): GLUCAP in the last 168 hours.  Radiological Exams on  Admission: Ct Head Wo Contrast  04/02/2015   CLINICAL DATA:  Vision loss and syncope the left arm numbness  EXAM: CT HEAD WITHOUT CONTRAST  TECHNIQUE: Contiguous axial images were obtained from the base of the skull through the vertex without intravenous contrast.  COMPARISON:  None.  FINDINGS: The ventricles are normal in size and configuration. There is no intracranial mass, hemorrhage, extra-axial fluid collection, or midline shift. Gray-white compartments appear normal. No acute infarct evident. The bony calvarium appears intact. The mastoid air cells are clear. There is a retention cyst in the posterior left maxillary antrum.  IMPRESSION: Retention cyst in posterior left maxillary antrum. No intracranial mass, hemorrhage, or focal gray - white compartment lesions/acute appearing infarct.   Electronically Signed   By: Lowella Grip III M.D.   On: 04/02/2015 14:07  EKG: Independently reviewed. Sinus rhythm.  Assessment/Plan Principal Problem:   Cerebrovascular accident involving posterior circulation Active Problems:   CVA (cerebral infarction)   1. Cerebrovascular accident. Patient having transient visual changes in the past month that was further worked up with an MRI of brain at an outside facility that showed left PCA territory infarct. This morning he experienced complete loss of vision over his left peripheral eye. Presenting EKG showed sinus rhythm. He was seen and evaluated by neurology recommending further inpatient neurologic workup. Plan to repeat MRI/MRA of brain and obtain transthoracic echocardiogram, carotid Dopplers along with physical therapy/occupational therapy/speech pathology consult. Plan to continue his Plavix. Place patient on CVA protocol.   Code Status: Full code DVT Prophylaxis Lovenox Family Communication: Spoke with his wife was present at bedside Disposition Plan: Will admit to the inpatient service  Time spent: 60 minutes  Kelvin Cellar [facility] Pager [phone]

## 2015-04-02 NOTE — ED Provider Notes (Signed)
CSN: [id number]     Arrival date & time 04/02/15  1240 History   First MD Initiated Contact with Patient 04/02/15 1311     Chief Complaint  Patient presents with  . Visual Field Change     (Consider location/radiation/quality/duration/timing/severity/associated sxs/prior Treatment) HPI Comments: Patient is a 69 year old male presenting with a 1 week history of intermittent left eye vision loss with associated left arm paresthesias. Symptoms are intermittent without known trigger. The episodes will last around 10 minutes and have been increasing in length since the onset. He denies any pain. Today he had an episode that was 45 minutes long and had some associated gait ataxia. Patient was seen at his PCP office and had an MRI that showed a recent stroke. He was started on Plavix yesterday but continues to have symptoms. No aggravating/alleviating factors. No other associated symptoms.    History reviewed. No pertinent past medical history. History reviewed. No pertinent past surgical history. History reviewed. No pertinent family history. Social History  Substance Use Topics  . Smoking status: Never Smoker   . Smokeless tobacco: Never Used  . Alcohol Use: None    Review of Systems  Eyes: Positive for visual disturbance.  Neurological: Positive for numbness and headaches.  All other systems reviewed and are negative.     Allergies  Review of patient's allergies indicates no known allergies.  Home Medications   Prior to Admission medications   Not on File   BP 136/91 mmHg  Pulse 74  Temp(Src) 97.7 F (36.5 C) (Oral)  Resp 18  Ht 6\' 2"  (1.88 m)  Wt 232 lb (105.235 kg)  BMI 29.77 kg/m2  SpO2 98% Physical Exam  Constitutional: He is oriented to person, place, and time. He appears well-developed and well-nourished. No distress.  HENT:  Head: Normocephalic and atraumatic.  Mouth/Throat: Oropharynx is clear and moist. No oropharyngeal exudate.  Eyes: Conjunctivae and EOM are  normal. Pupils are equal, round, and reactive to light.  Neck: Normal range of motion.  Cardiovascular: Normal rate and regular rhythm.  Exam reveals no gallop and no friction rub.   No murmur heard. Pulmonary/Chest: Effort normal and breath sounds normal. He has no wheezes. He has no rales. He exhibits no tenderness.  Abdominal: Soft. He exhibits no distension. There is no tenderness. There is no rebound.  Musculoskeletal: Normal range of motion.  Neurological: He is alert and oriented to person, place, and time. No cranial nerve deficit. Coordination normal.  Speech is goal-oriented. Moves limbs without ataxia. Extremity strength and sensation equal and intact bilaterally.   Skin: Skin is warm and dry.  Psychiatric: He has a normal mood and affect. His behavior is normal.  Nursing note and vitals reviewed.   ED Course  Procedures (including critical care time) Labs Review Labs Reviewed  CBC WITH DIFFERENTIAL/PLATELET - Abnormal; Notable for the following:    Eosinophils Relative 6 (*)    All other components within normal limits  PROTIME-INR  BASIC METABOLIC PANEL  I-STAT TROPOININ, ED    Imaging Review Ct Head Wo Contrast  04/02/2015   CLINICAL DATA:  Vision loss and syncope the left arm numbness  EXAM: CT HEAD WITHOUT CONTRAST  TECHNIQUE: Contiguous axial images were obtained from the base of the skull through the vertex without intravenous contrast.  COMPARISON:  None.  FINDINGS: The ventricles are normal in size and configuration. There is no intracranial mass, hemorrhage, extra-axial fluid collection, or midline shift. Gray-white compartments appear normal. No acute infarct  evident. The bony calvarium appears intact. The mastoid air cells are clear. There is a retention cyst in the posterior left maxillary antrum.  IMPRESSION: Retention cyst in posterior left maxillary antrum. No intracranial mass, hemorrhage, or focal gray - white compartment lesions/acute appearing infarct.    Electronically Signed   By: Lowella Grip III M.D.   On: 04/02/2015 14:07   I have personally reviewed and evaluated these images and lab results as part of my medical decision-making.   EKG Interpretation   Date/Time:  Thursday April 02 2015 12:43:52 EDT Ventricular Rate:  77 PR Interval:  212 QRS Duration: 146 QT Interval:  412 QTC Calculation: 466 R Axis:   23 Text Interpretation:  Sinus rhythm with 1st degree A-V block Right bundle  branch block Confirmed by Ashok Cordia  MD, Lennette Bihari ([id number]) on 04/02/2015 1:29:30 PM      MDM   Final diagnoses:  Cerebrovascular accident involving posterior circulation    1:59 PM  Labs and CT head pending. No neuro deficits at this time. Dr. Armida Sans saw the patient and will consult.     Alvina Chou, PA-C 04/02/15 [place], MD 04/03/15 [phone]

## 2015-04-02 NOTE — ED Notes (Signed)
md at bedside. neuro

## 2015-04-03 ENCOUNTER — Inpatient Hospital Stay (HOSPITAL_COMMUNITY): Payer: Medicare HMO

## 2015-04-03 DIAGNOSIS — I10 Essential (primary) hypertension: Secondary | ICD-10-CM | POA: Insufficient documentation

## 2015-04-03 DIAGNOSIS — I6350 Cerebral infarction due to unspecified occlusion or stenosis of unspecified cerebral artery: Secondary | ICD-10-CM

## 2015-04-03 DIAGNOSIS — I639 Cerebral infarction, unspecified: Secondary | ICD-10-CM

## 2015-04-03 DIAGNOSIS — I6340 Cerebral infarction due to embolism of unspecified cerebral artery: Secondary | ICD-10-CM

## 2015-04-03 DIAGNOSIS — E785 Hyperlipidemia, unspecified: Secondary | ICD-10-CM | POA: Insufficient documentation

## 2015-04-03 LAB — BASIC METABOLIC PANEL
ANION GAP: 7 (ref 5–15)
BUN: 19 mg/dL (ref 6–20)
CHLORIDE: 103 mmol/L (ref 101–111)
CO2: 27 mmol/L (ref 22–32)
CREATININE: 1.46 mg/dL — AB (ref 0.61–1.24)
Calcium: 9.2 mg/dL (ref 8.9–10.3)
GFR calc non Af Amer: 47 mL/min — ABNORMAL LOW (ref >60)
GFR, EST AFRICAN AMERICAN: 55 mL/min — AB (ref >60)
Glucose, Bld: 101 mg/dL — ABNORMAL HIGH (ref 65–99)
Potassium: 3.8 mmol/L (ref 3.5–5.1)
SODIUM: 137 mmol/L (ref 135–145)

## 2015-04-03 LAB — CBC
HCT: 44.7 % (ref 39.0–52.0)
HEMOGLOBIN: 14.9 g/dL (ref 13.0–17.0)
MCH: 31.4 pg (ref 26.0–34.0)
MCHC: 33.3 g/dL (ref 30.0–36.0)
MCV: 94.1 fL (ref 78.0–100.0)
PLATELETS: 189 10*3/uL (ref 150–400)
RBC: 4.75 MIL/uL (ref 4.22–5.81)
RDW: 13.1 % (ref 11.5–15.5)
WBC: 7.6 10*3/uL (ref 4.0–10.5)

## 2015-04-03 LAB — LIPID PANEL
CHOLESTEROL: 183 mg/dL (ref 0–200)
HDL: 34 mg/dL — ABNORMAL LOW (ref >40)
LDL Cholesterol: 124 mg/dL — ABNORMAL HIGH (ref 0–99)
Total CHOL/HDL Ratio: 5.4 RATIO
Triglycerides: 123 mg/dL (ref <150)
VLDL: 25 mg/dL (ref 0–40)

## 2015-04-03 LAB — VITAMIN B12: VITAMIN B 12: 449 pg/mL (ref 180–914)

## 2015-04-03 LAB — HEMOGLOBIN A1C
HEMOGLOBIN A1C: 5.7 % — AB (ref 4.8–5.6)
Mean Plasma Glucose: 117 mg/dL

## 2015-04-03 MED ORDER — SODIUM CHLORIDE 0.9 % IV SOLN
INTRAVENOUS | Status: DC
  Administered 2015-04-03: 20:00:00 via INTRAVENOUS

## 2015-04-03 MED ORDER — ASPIRIN EC 81 MG PO TBEC
81.0000 mg | DELAYED_RELEASE_TABLET | Freq: Every day | ORAL | Status: DC
  Administered 2015-04-03 – 2015-04-04 (×2): 81 mg via ORAL
  Filled 2015-04-03 (×2): qty 1

## 2015-04-03 MED ORDER — BUTALBITAL-APAP-CAFFEINE 50-325-40 MG PO TABS
1.0000 | ORAL_TABLET | Freq: Three times a day (TID) | ORAL | Status: DC | PRN
  Administered 2015-04-03 – 2015-04-04 (×3): 1 via ORAL
  Filled 2015-04-03 (×3): qty 1

## 2015-04-03 NOTE — Progress Notes (Signed)
[facility] PROGRESS NOTE  [name] UKG:[id number] DOB: 1945/09/25 DOA: 04/02/2015 PCP: No primary care provider on file.  Assessment/Plan: 69 y/o male with PMH of HTN, HPL, presented with left eye vision loss, found to have acute CVA  1. Acute CVA. MRI: Acute nonhemorrhagic infarct involving portions of the right occipital lobe and right thalamus. Echo: LVEF 55%,. No cardiac source of emboli was indentified. LDL-124. HDL-34. ha1c-5.7. Patient is on ASA/plavix, statin. Neurology is following   2. HTN. Permissive HTN 24-48 hrs. BP is stable off meds. Resume BP meds in AM as needed. Prn hydralazine for now  3. HPL on statin  Code Status: full Family Communication: d/w patient (indicate person spoken with, relationship, and if by phone, the number) Disposition Plan: home 24-48 hrs    Consultants:  Neurology   Procedures:  Echo   Antibiotics:  none (indicate start date, and stop date if known)  HPI/Subjective: Alert, no pains   Objective: Filed Vitals:   04/03/15 0952  BP: 124/73  Pulse: 74  Temp: 98 F (36.7 C)  Resp: 17   No intake or output data in the 24 hours ending 04/03/15 1422 Filed Weights   04/02/15 1246  Weight: 105.235 kg (232 lb)    Exam:   General:  No distress   Cardiovascular: s1,s2 rrr  Respiratory: CTA BL  Abdomen: soft, nt,nd   Musculoskeletal: no leg edema   Data Reviewed: Basic Metabolic Panel:  Recent Labs Lab 04/02/15 1247 04/03/15 0427  NA 138 137  K 4.0 3.8  CL 104 103  CO2 27 27  GLUCOSE 99 101*  BUN 20 19  CREATININE 1.46* 1.46*  CALCIUM 9.9 9.2   Liver Function Tests: No results for input(s): AST, ALT, ALKPHOS, BILITOT, PROT, ALBUMIN in the last 168 hours. No results for input(s): LIPASE, AMYLASE in the last 168 hours. No results for input(s): AMMONIA in the last 168 hours. CBC:  Recent Labs Lab 04/02/15 1247 04/03/15 0427  WBC 7.9 7.6  NEUTROABS 4.1  --   HGB 16.3 14.9  HCT 48.1 44.7  MCV 94.1  94.1  PLT 235 189   Cardiac Enzymes: No results for input(s): CKTOTAL, CKMB, CKMBINDEX, TROPONINI in the last 168 hours. BNP (last 3 results) No results for input(s): BNP in the last 8760 hours.  ProBNP (last 3 results) No results for input(s): PROBNP in the last 8760 hours.  CBG: No results for input(s): GLUCAP in the last 168 hours.  No results found for this or any previous visit (from the past 240 hour(s)).   Studies: Ct Head Wo Contrast  04/02/2015   CLINICAL DATA:  Vision loss and syncope the left arm numbness  EXAM: CT HEAD WITHOUT CONTRAST  TECHNIQUE: Contiguous axial images were obtained from the base of the skull through the vertex without intravenous contrast.  COMPARISON:  None.  FINDINGS: The ventricles are normal in size and configuration. There is no intracranial mass, hemorrhage, extra-axial fluid collection, or midline shift. Gray-white compartments appear normal. No acute infarct evident. The bony calvarium appears intact. The mastoid air cells are clear. There is a retention cyst in the posterior left maxillary antrum.  IMPRESSION: Retention cyst in posterior left maxillary antrum. No intracranial mass, hemorrhage, or focal gray - white compartment lesions/acute appearing infarct.   Electronically Signed   By: Lowella Grip III M.D.   On: 04/02/2015 14:07   Mr Jodene Nam Head Wo Contrast  04/02/2015   CLINICAL DATA:  89 old hypertensive male presenting with  left visual loss. Subsequent encounter.  EXAM: MRI HEAD WITHOUT CONTRAST  MRA HEAD WITHOUT CONTRAST  TECHNIQUE: Multiplanar, multiecho pulse sequences of the brain and surrounding structures were obtained without intravenous contrast. Angiographic images of the head were obtained using MRA technique without contrast.  COMPARISON:  04/02/2015 head CT.  No comparison brain MR.  FINDINGS: MRI HEAD FINDINGS  Acute nonhemorrhagic infarct involving portions of the right occipital lobe and right thalamus.  No intracranial mass  lesion noted on this unenhanced exam.  No intracranial hemorrhage.  No hydrocephalus.  Cerebellar tonsils minimally low lying within range of limits.  Post lens replacement otherwise orbital structures unremarkable.  Pituitary and pineal region within normal limits.  Complex polypoid opacification inferior aspect left maxillary sinus.  MRA HEAD FINDINGS  Ectatic dominant right vertebral artery.  Left vertebral artery is small and irregularity after the takeoff of the left posterior inferior cerebral artery.  Irregular prominent size left posterior inferior cerebellar artery. Poor delineation of the right posterior inferior cerebellar artery.  Mild focal narrowing left lateral aspect of the proximal basilar artery. Mild smooth narrowing mid basilar artery.  High-grade long segment stenosis majority of the right posterior cerebral artery.  High grade focal stenosis P2 segment left posterior cerebral artery.  Ectatic internal carotid arteries bilaterally.  Anterior circulation without medium or large size vessel significant stenosis or occlusion.  Probable artifact left cavernous segment without aneurysm noted.  IMPRESSION: MRI HEAD  Acute nonhemorrhagic infarct involving portions of the right occipital lobe and right thalamus.  Complex polypoid opacification inferior aspect left maxillary sinus.  MRA HEAD  Intracranial atherosclerotic type changes most notable posterior circulation including:  High-grade long segment stenosis majority of the right posterior cerebral artery.  High grade focal stenosis P2 segment left posterior cerebral artery  Please see above for further detail.   Electronically Signed   By: Genia Del M.D.   On: 04/02/2015 19:17   Mr Brain Wo Contrast  04/02/2015   CLINICAL DATA:  43 old hypertensive male presenting with left visual loss. Subsequent encounter.  EXAM: MRI HEAD WITHOUT CONTRAST  MRA HEAD WITHOUT CONTRAST  TECHNIQUE: Multiplanar, multiecho pulse sequences of the brain and  surrounding structures were obtained without intravenous contrast. Angiographic images of the head were obtained using MRA technique without contrast.  COMPARISON:  04/02/2015 head CT.  No comparison brain MR.  FINDINGS: MRI HEAD FINDINGS  Acute nonhemorrhagic infarct involving portions of the right occipital lobe and right thalamus.  No intracranial mass lesion noted on this unenhanced exam.  No intracranial hemorrhage.  No hydrocephalus.  Cerebellar tonsils minimally low lying within range of limits.  Post lens replacement otherwise orbital structures unremarkable.  Pituitary and pineal region within normal limits.  Complex polypoid opacification inferior aspect left maxillary sinus.  MRA HEAD FINDINGS  Ectatic dominant right vertebral artery.  Left vertebral artery is small and irregularity after the takeoff of the left posterior inferior cerebral artery.  Irregular prominent size left posterior inferior cerebellar artery. Poor delineation of the right posterior inferior cerebellar artery.  Mild focal narrowing left lateral aspect of the proximal basilar artery. Mild smooth narrowing mid basilar artery.  High-grade long segment stenosis majority of the right posterior cerebral artery.  High grade focal stenosis P2 segment left posterior cerebral artery.  Ectatic internal carotid arteries bilaterally.  Anterior circulation without medium or large size vessel significant stenosis or occlusion.  Probable artifact left cavernous segment without aneurysm noted.  IMPRESSION: MRI HEAD  Acute nonhemorrhagic infarct involving  portions of the right occipital lobe and right thalamus.  Complex polypoid opacification inferior aspect left maxillary sinus.  MRA HEAD  Intracranial atherosclerotic type changes most notable posterior circulation including:  High-grade long segment stenosis majority of the right posterior cerebral artery.  High grade focal stenosis P2 segment left posterior cerebral artery  Please see above for  further detail.   Electronically Signed   By: Genia Del M.D.   On: 04/02/2015 19:17    Scheduled Meds: .  stroke: mapping our early stages of recovery book   Does not apply Once  . aspirin EC  81 mg Oral Daily  . atorvastatin  80 mg Oral Daily  . clopidogrel  75 mg Oral Daily  . enoxaparin (LOVENOX) injection  40 mg Subcutaneous Q24H   Continuous Infusions: . sodium chloride 75 mL/hr at 04/03/15 1230    Principal Problem:   Cerebrovascular accident involving posterior circulation Active Problems:   CVA (cerebral infarction)    Time spent: >35 minutes     Kinnie Feil  [facility] Pager [phone]. If 7PM-7AM, please contact night-coverage at [email or web address], password [facility] 04/03/2015, 2:22 PM  LOS: 1 day

## 2015-04-03 NOTE — Progress Notes (Signed)
STROKE TEAM PROGRESS NOTE  HPI DENNARD VEZINA is an 69 y.o. male, right handed, without pertinent past medical history, comes in for evaluation of visual loss left eye, right arm numbness, unsteadiness, HA, outside MRI brain showing stroke He indicated that he has been experiencing vision changes in both eyes for a couple of weeks and HA, but yesterday got worse and he saw his primary care physician who ordered a brain MRI that was positive for stroke. His physician advised him to start taking plavix (said that he has been taking aspirin 81 daily for quite some time) and ordered several tests that be completed as outpatient. He comes today stating that today he had " new symptoms" of transient visual loss left eye as well as numbness right hand, pain behind the right eye, and imbalance. Denies vertigo, focal weakness, slurred speech, confusion, or language impairment. Outside MRI brain was personally reviewed and showed a right PCA distribution infarct involving right occipital lobe and portions of the right posterior temporal lobe.   Date last known well: unable to the determine Time last known well: unable to deteermine tPA Given: no, out of the window   SUBJECTIVE (INTERVAL HISTORY) Multiple family members at the bedside. The patient reported transient visual changes since June as well as some transient speech difficulties. He also has a long history of headaches, possibly migraines. Visual changes resemble migraine aura. However, pt this time developed left hemianopia. MRI showed embolic pattern but involving right occipital, right temporal and right thalamus. MRA showed right PCA occlusion with left PCA high grade stenosis.    OBJECTIVE Temp:  [97.7 F (36.5 C)-98.2 F (36.8 C)] 98.2 F (36.8 C) (09/02 0540) Pulse Rate:  [61-78] 61 (09/02 0540) Cardiac Rhythm:  [-] Normal sinus rhythm (09/02 0400) Resp:  [17-22] 17 (09/02 0540) BP: (105-143)/(55-91) 110/55 mmHg (09/02 0540) SpO2:  [95  %-100 %] 98 % (09/02 0540) Weight:  [105.235 kg (232 lb)] 105.235 kg (232 lb) (09/01 1246)  CBC:  Recent Labs Lab 04/02/15 1247 04/03/15 0427  WBC 7.9 7.6  NEUTROABS 4.1  --   HGB 16.3 14.9  HCT 48.1 44.7  MCV 94.1 94.1  PLT 235 295    Basic Metabolic Panel:  Recent Labs Lab 04/02/15 1247 04/03/15 0427  NA 138 137  K 4.0 3.8  CL 104 103  CO2 27 27  GLUCOSE 99 101*  BUN 20 19  CREATININE 1.46* 1.46*  CALCIUM 9.9 9.2    Lipid Panel:    Component Value Date/Time   CHOL 183 04/03/2015 0421   TRIG 123 04/03/2015 0421   HDL 34* 04/03/2015 0421   CHOLHDL 5.4 04/03/2015 0421   VLDL 25 04/03/2015 0421   LDLCALC 124* 04/03/2015 0421   HgbA1c:  Lab Results  Component Value Date   HGBA1C 5.7* 04/02/2015   Urine Drug Screen: No results found for: LABOPIA, COCAINSCRNUR, LABBENZ, AMPHETMU, THCU, LABBARB    IMAGING  I have personally reviewed the radiological images below and agree with the radiology interpretations.  Ct Head Wo Contrast 04/02/2015    Retention cyst in posterior left maxillary antrum. No intracranial mass, hemorrhage, or focal gray - white compartment lesions/acute appearing infarct.     [name] Wo Contrast 04/02/2015    MRI HEAD   Acute nonhemorrhagic infarct involving portions of the right occipital lobe and right thalamus.  Complex polypoid opacification inferior aspect left maxillary sinus.   MRA HEAD  Intracranial atherosclerotic type changes most notable posterior circulation including:  High-grade long segment stenosis majority of the right posterior cerebral artery.  High grade focal stenosis P2 segment left posterior cerebral artery    CUS - Bilateral: 1-39% ICA stenosis. Vertebral artery flow is antegrade.  2D echo - - Left ventricle: The cavity size was normal. There was mild concentric hypertrophy. Systolic function was normal. The estimated ejection fraction was in the range of 50% to 55%. Wall motion was normal; there were no  regional wall motion abnormalities. Doppler parameters are consistent with abnormal left ventricular relaxation (grade 1 diastolic dysfunction). - Left atrium: The atrium was mildly dilated. - Right ventricle: The cavity size was mildly dilated. Wall thickness was normal. Impressions: - No cardiac source of emboli was indentified.   PHYSICAL EXAM Physical exam  Temp:  [97.8 F (36.6 C)-98.2 F (36.8 C)] 97.8 F (36.6 C) (09/02 2100) Pulse Rate:  [61-93] 72 (09/02 2100) Resp:  [17-18] 18 (09/02 2100) BP: (107-137)/(55-86) 131/84 mmHg (09/02 2100) SpO2:  [95 %-98 %] 98 % (09/02 2100)  General - Well nourished, well developed, in no apparent distress.  Ophthalmologic - Sharp disc margins OU.  Cardiovascular - Regular rate and rhythm with no murmur.  Mental Status -  Level of arousal and orientation to time, place, and person were intact. Language including expression, naming, repetition, comprehension was assessed and found intact. Fund of Knowledge was assessed and was intact.  Cranial Nerves II - XII - II - Visual field intact OU. III, IV, VI - Extraocular movements intact. V - Facial sensation intact bilaterally. VII - Facial movement intact bilaterally. VIII - Hearing & vestibular intact bilaterally. X - Palate elevates symmetrically. XI - Chin turning & shoulder shrug intact bilaterally. XII - Tongue protrusion intact.  Motor Strength - The patient's strength was normal in all extremities and pronator drift was absent.  Bulk was normal and fasciculations were absent.   Motor Tone - Muscle tone was assessed at the neck and appendages and was normal.  Reflexes - The patient's reflexes were 1+ in all extremities and he had no pathological reflexes.  Sensory - Light touch, temperature/pinprick, vibration and proprioception, and Romberg testing were assessed and were symmetrical.    Coordination - The patient had normal movements in the hands and feet with no  ataxia or dysmetria.  Tremor was absent.  Gait and Station - deferred due to fatigue post PT/OT evaluation.   ASSESSMENT/PLAN Mr. SISTO GRANILLO is a 69 y.o. male with history of hypertension and headaches presenting with transient visual changes and speech difficulties. He did not receive IV t-PA due to late presentation.  Stroke:  Right occipital and temporal as well as right thalamic infarct, involving right PCA and MCA, possibly embolic from an unknown source - initial symptoms resemble migraine aura.  Resultant resolution of deficits  MRI  Acute nonhemorrhagic infarct involving portions of the right occipital lobe and right thalamus.   MRA  Right PCA occlusion and left P2 high grade stenosis  Carotid Doppler  Unremarkable.  Will do CTA neck to better evaluate posterior circulation.   Consider outpt TEE and loop recorder. Cardiology contacted.  2D Echo  EF 50-55%. No cardiac source of emboli identified.  LDL 124  HgbA1c 5.7  Lovenox for VTE prophylaxis  Diet Heart Room service appropriate?: Yes; Fluid consistency:: Thin  aspirin 81 mg orally every day and clopidogrel 75 mg orally every day prior to admission, now on aspirin 81 mg orally every day and clopidogrel 75 mg orally every day. Continue  dural antiplatelet for 3 months and then plavix alone.   Patient counseled to be compliant with his antithrombotic medications  Ongoing aggressive stroke risk factor management  Therapy recommendations:  No follow-up physical therapy recommended  Disposition:  Pending  Hypertension  Stable  Permissive hypertension (OK if < 220/120) but gradually normalize in 5-7 days  Home meds - HCTZ and irbesartan  Hyperlipidemia  Home meds:  Lipitor 80 mg daily resumed in hospital  LDL 124, goal < 70  Continue statin at discharge  Other Stroke Risk Factors  Advanced age  Possible Migraines   Other Active Problems  Mildly elevated creatinine  Hospital day # 1  Rosalin Hawking, MD PhD Stroke Neurology 04/03/2015 11:01 PM   To contact Stroke Continuity provider, please refer to [email or web address] After hours, contact General Neurology

## 2015-04-03 NOTE — Evaluation (Signed)
Speech Language Pathology Evaluation Patient Details Name: CORDARYL DECELLES MRN: [id number] DOB: 07/23/1946 Today's Date: 04/03/2015 Time: 9767-3419 SLP Time Calculation (min) (ACUTE ONLY): 35 min  Problem List:  Patient Active Problem List   Diagnosis Date Noted  . Cerebrovascular accident involving posterior circulation 04/02/2015  . CVA (cerebral infarction) 04/02/2015  . Nonspecific (abnormal) findings on radiological and other examination of body structure 06/02/2008  . ABNORMAL CHEST XRAY 06/02/2008  . INGUINAL HERNIA, LEFT 05/29/2008  . GERD 05/19/2008  . ABDOMINAL PAIN-LLQ 05/19/2008   Past Medical History:  Past Medical History  Diagnosis Date  . Hypertension   . Stroke 04/02/2015  . GERD (gastroesophageal reflux disease)   . Headache   . Arthritis     KNEES   . Skin cancer     hx of   Past Surgical History:  Past Surgical History  Procedure Laterality Date  . Hernia repair    . Knee surgery Right 12/2014  . Skin cancer excision  12/2014  . Cataract extraction    . Colonoscopy     HPI:  69 y.o. male admitted with visual changes, acute right PCA distribution infarct involving right occipital lobe and portions of the right posterior temporal lobe.   Assessment / Plan / Recommendation Clinical Impression  Pt presents with normal language, speech, and cognition - scored 26/30 per MOCA.  Insight/judgement intact.  Visual deficits, dizziness are primary deficits.  No SLP f/u warranted.  Educated pt/family re: results/recs.      SLP Assessment  Patient does not need any further Speech Lanaguage Pathology Services          Pertinent Vitals/Pain Pain Assessment: No/denies pain   SLP Goals     SLP Evaluation Prior Functioning  Cognitive/Linguistic Baseline: Within functional limits  Lives With: Spouse Available Help at Discharge: Family   Cognition  Overall Cognitive Status: Within Functional Limits for tasks assessed Arousal/Alertness: Awake/alert Orientation  Level: Oriented X4 Attention: Alternating Alternating Attention: Appears intact Memory: Appears intact Awareness: Appears intact Problem Solving: Appears intact Safety/Judgment: Appears intact    Comprehension  Auditory Comprehension Overall Auditory Comprehension: Appears within functional limits for tasks assessed Visual Recognition/Discrimination Discrimination: Within Function Limits Reading Comprehension Reading Status: Within funtional limits    Expression Expression Primary Mode of Expression: Verbal Verbal Expression Overall Verbal Expression: Appears within functional limits for tasks assessed Written Expression Written Expression: Not tested   Oral / Motor Oral Motor/Sensory Function Overall Oral Motor/Sensory Function: Appears within functional limits for tasks assessed Motor Speech Overall Motor Speech: Appears within functional limits for tasks assessed   GO     Juan Quam Laurice 04/03/2015, 10:40 AM

## 2015-04-03 NOTE — Progress Notes (Signed)
Utilization review completed. Rennae Ferraiolo, RN, BSN. 

## 2015-04-03 NOTE — Evaluation (Signed)
Physical Therapy Evaluation Patient Details Name: [name] MRN: [id number] DOB: 05-26-1946 Today's Date: 04/03/2015   History of Present Illness  69 y.o. male admitted with visual changes. Imaging showed Roccipital and thalamus infarct. He had R knee meniscus repair in June 2016.  Clinical Impression  Pt is independent with mobility. He walked 220' without assistive device, no loss of balance. He reports L visual field cut in B eyes. No PT indicated. Encouraged pt to walk in halls 3x/day. PT signing off.     Follow Up Recommendations No PT follow up    Equipment Recommendations  None recommended by PT    Recommendations for Other Services       Precautions / Restrictions Precautions Precautions: Other (comment) Precaution Comments: L visual field cut Restrictions Weight Bearing Restrictions: No      Mobility  Bed Mobility Overal bed mobility: Independent                Transfers Overall transfer level: Independent                  Ambulation/Gait Ambulation/Gait assistance: Independent Ambulation Distance (Feet): 220 Feet Assistive device: None Gait Pattern/deviations: WFL(Within Functional Limits)   Gait velocity interpretation: at or above normal speed for age/gender General Gait Details: steady, no LOB, reports feeling a little "off" due to L visual field cut  Stairs            Wheelchair Mobility    Modified Rankin (Stroke Patients Only)       Balance Overall balance assessment: Independent                                           Pertinent Vitals/Pain Pain Assessment: No/denies pain    Home Living Family/patient expects to be discharged to:: Private residence Living Arrangements: Spouse/significant other Available Help at Discharge: Family           Home Equipment: Other (comment) (walking stick)      Prior Function Level of Independence: Independent         Comments: pt is retired, Ship broker at [employer]     Journalist, newspaper        Extremity/Trunk Assessment   Upper Extremity Assessment: Overall WFL for tasks assessed (reports intermittent tingling in L hand, sensation intact to light touch L hand)           Lower Extremity Assessment: Overall WFL for tasks assessed      Cervical / Trunk Assessment: Normal  Communication   Communication: No difficulties  Cognition Arousal/Alertness: Awake/alert Behavior During Therapy: WFL for tasks assessed/performed Overall Cognitive Status: Within Functional Limits for tasks assessed                      General Comments      Exercises        Assessment/Plan    PT Assessment Patent does not need any further PT services  PT Diagnosis     PT Problem List    PT Treatment Interventions     PT Goals (Current goals can be found in the Care Plan section) Acute Rehab PT Goals Patient Stated Goal: resume prior activities, work out at gym PT Goal Formulation: All assessment and education complete, DC therapy    Frequency     Barriers to discharge  Co-evaluation               End of Session   Activity Tolerance: Patient tolerated treatment well;No increased pain Patient left: in chair;with call bell/phone within reach;with family/visitor present Nurse Communication: Mobility status         Time: 7445-1460 PT Time Calculation (min) (ACUTE ONLY): 18 min   Charges:   PT Evaluation $Initial PT Evaluation Tier I: 1 Procedure     PT G Codes:        Blondell Reveal Kistler 04/03/2015, 12:19 PM [phone]

## 2015-04-03 NOTE — Progress Notes (Signed)
VASCULAR LAB PRELIMINARY  PRELIMINARY  PRELIMINARY  PRELIMINARY  Carotid duplex completed.    Preliminary report:  Right - no evidence of ICA stenosis. Left - 1% to 39% ICA stenosis. Bilateral - Vertebral artery flow is antegrade.  Micajah Dennin, [place], RVS 04/03/2015, 5:07 PM

## 2015-04-04 ENCOUNTER — Encounter (HOSPITAL_COMMUNITY): Payer: Self-pay | Admitting: Radiology

## 2015-04-04 ENCOUNTER — Inpatient Hospital Stay (HOSPITAL_COMMUNITY): Payer: Medicare HMO

## 2015-04-04 LAB — BASIC METABOLIC PANEL
Anion gap: 7 (ref 5–15)
BUN: 23 mg/dL — AB (ref 6–20)
CHLORIDE: 104 mmol/L (ref 101–111)
CO2: 28 mmol/L (ref 22–32)
Calcium: 9 mg/dL (ref 8.9–10.3)
Creatinine, Ser: 1.41 mg/dL — ABNORMAL HIGH (ref 0.61–1.24)
GFR calc Af Amer: 57 mL/min — ABNORMAL LOW (ref >60)
GFR calc non Af Amer: 49 mL/min — ABNORMAL LOW (ref >60)
Glucose, Bld: 101 mg/dL — ABNORMAL HIGH (ref 65–99)
POTASSIUM: 4.1 mmol/L (ref 3.5–5.1)
SODIUM: 139 mmol/L (ref 135–145)

## 2015-04-04 LAB — HEMOGLOBIN A1C
Hgb A1c MFr Bld: 5.7 % — ABNORMAL HIGH (ref 4.8–5.6)
Mean Plasma Glucose: 117 mg/dL

## 2015-04-04 MED ORDER — BUTALBITAL-APAP-CAFFEINE 50-325-40 MG PO TABS: 1.0000 | ORAL_TABLET | Freq: Three times a day (TID) | ORAL | Status: DC | PRN

## 2015-04-04 MED ORDER — IOHEXOL 350 MG/ML SOLN
80.0000 mL | Freq: Once | INTRAVENOUS | Status: AC | PRN
  Administered 2015-04-04: 80 mL via INTRAVENOUS

## 2015-04-04 NOTE — Progress Notes (Addendum)
Patient, wife, and family given DC instructions, handouts and prescriptions. Clarified with MD ASA and plavix X3 months, and TEE will be scheduled and then they will receive a call. Patient in [facility] escorted to lobby. Patient to be transported home by s-i-l in private vehicle. Patient denied EMMI.

## 2015-04-04 NOTE — Discharge Summary (Addendum)
Physician Discharge Summary  MIRKO TAILOR RAQ:[id number] DOB: 03/15/1946 DOA: 04/02/2015  PCP: No primary care provider on file.  Admit date: 04/02/2015 Discharge date: 04/04/2015  Time spent: 40 minutes  Recommendations for Outpatient Follow-up:  1. Follow-up with neurology as outpatient. 2. Continue aspirin/Plavix dual antiplatelets therapy for 3 months then continue Plavix alone. 3. Follow-up with primary care physician for incidental findings on the CT scan, findings of pulmonary nodules, need non-emergent CT scan as outpatient. 4. Outpatient follow-up with cardiology for TEE, then consider loop recorder if TEE is negative.  Discharge Diagnoses:  Principal Problem:   Cerebrovascular accident involving posterior circulation Active Problems:   CVA (cerebral infarction)   Stroke with cerebral ischemia   Essential hypertension   HLD (hyperlipidemia)   Discharge Condition: Stable  Diet recommendation: Heart healthy  Filed Weights   04/02/15 1246  Weight: 105.235 kg (232 lb)    History of present illness:  [name] is a 69 y.o. male with no cystic and past medical history presenting to the emergency department with complaints of left visual loss. He experience visual changes to both eyes about a month ago that was associate with headaches. He had been at a craft fair at the time symptoms started and thought these were secondary to being outside in the heat. Since then he had been expressing visual changes intermittently for which his primary care physician ordered an MRI of brain. This revealed last PCA territory infarct. This morning he reported experiencing complete lost the vision in his left eye after stepping out of the shower, symptoms which are still present in the emergency department. He was seen and evaluated by neurology who recommended further workup with repeat MRI, transthoracic echocardiogram and carotid Dopplers.  Hospital Course:   Acute CVA Patient presented  to the hospital with left-sided visual loss. Upon questioning patient mentioned he has also some right-sided blurry vision/visual deterioration. MRI: Acute nonhemorrhagic infarct involving portions of the right occipital lobe and right thalamus.  Echo: LVEF 55%, No cardiac source of emboli was indentified.  Neurology were following in the hospital. On discharge and recommended aspirin and Plavix for 3 more months then Plavix alone. Neurology recommended TEE to rule out cardiac sources of embolic stroke, if no findings patient will need a loop recorder. Staff message sent to cardiology to set up outpatient TEE.  HTN Permissive HTN 24-48 hrs. BP is stable off meds. Resume BP meds in AM as needed. Prn hydralazine for now   HPL  Total cholesterol is 183, LDL is 124, patient is on 80 mg of Lipitor, continued  Headache Likely secondary to stroke, Fioricet prescribed.   Procedures:  None  Consultations:  Neurology  Discharge Exam: Filed Vitals:   04/04/15 0924  BP: 137/77  Pulse: 61  Temp: 97.8 F (36.6 C)  Resp: 18   General: Alert and awake, oriented x3, not in any acute distress. HEENT: anicteric sclera, pupils reactive to light and accommodation, EOMI CVS: S1-S2 clear, no murmur rubs or gallops Chest: clear to auscultation bilaterally, no wheezing, rales or rhonchi Abdomen: soft nontender, nondistended, normal bowel sounds, no organomegaly Extremities: no cyanosis, clubbing or edema noted bilaterally Neuro: Cranial nerves II-XII intact, no focal neurological deficits  Discharge Instructions   Discharge Instructions    Ambulatory referral to Neurology    Complete by:  As directed   Dr. Erlinda Hong requests follow up for this patient in 2 months.     Diet - low sodium heart healthy  Complete by:  As directed      Increase activity slowly    Complete by:  As directed           Current Discharge Medication List    START taking these medications   Details   butalbital-acetaminophen-caffeine (FIORICET, ESGIC) 50-325-40 MG per tablet Take 1 tablet by mouth every 8 (eight) hours as needed for headache. Qty: 45 tablet, Refills: 0      CONTINUE these medications which have NOT CHANGED   Details  aspirin 81 MG chewable tablet Chew 81 mg by mouth daily.    atorvastatin (LIPITOR) 80 MG tablet Take 80 mg by mouth daily.    clopidogrel (PLAVIX) 75 MG tablet Take 75 mg by mouth daily.    fluticasone (FLONASE) 50 MCG/ACT nasal spray Place 1 spray into both nostrils daily as needed for allergies or rhinitis.    hydrochlorothiazide (HYDRODIURIL) 25 MG tablet Take 25 mg by mouth daily.    hydrocortisone (ANUSOL-HC) 2.5 % rectal cream Place 1 application rectally daily as needed for hemorrhoids or itching.    irbesartan (AVAPRO) 300 MG tablet Take 300 mg by mouth daily.    levocetirizine (XYZAL) 5 MG tablet Take 5 mg by mouth every evening.    temazepam (RESTORIL) 15 MG capsule Take 30 mg by mouth at bedtime as needed for sleep.      STOP taking these medications     ibuprofen (ADVIL,MOTRIN) 200 MG tablet        Allergies  Allergen Reactions  . Hydrocodone Itching   Follow-up Information    Follow up with Xu,Jindong, MD. Schedule an appointment as soon as possible for a visit in 2 months.   Specialty:  Neurology   Contact information:   [address] [place] [address] [phone]        The results of significant diagnostics from this hospitalization (including imaging, microbiology, ancillary and laboratory) are listed below for reference.    Significant Diagnostic Studies: Ct Head Wo Contrast  04/02/2015   CLINICAL DATA:  Vision loss and syncope the left arm numbness  EXAM: CT HEAD WITHOUT CONTRAST  TECHNIQUE: Contiguous axial images were obtained from the base of the skull through the vertex without intravenous contrast.  COMPARISON:  None.  FINDINGS: The ventricles are normal in size and configuration. There  is no intracranial mass, hemorrhage, extra-axial fluid collection, or midline shift. Gray-white compartments appear normal. No acute infarct evident. The bony calvarium appears intact. The mastoid air cells are clear. There is a retention cyst in the posterior left maxillary antrum.  IMPRESSION: Retention cyst in posterior left maxillary antrum. No intracranial mass, hemorrhage, or focal gray - white compartment lesions/acute appearing infarct.   Electronically Signed   By: Lowella Grip III M.D.   On: 04/02/2015 14:07   Ct Angio Neck W/cm &/or Wo/cm  04/04/2015   CLINICAL DATA:  Recent stroke, LEFT vision problems.  EXAM: CT ANGIOGRAPHY NECK  TECHNIQUE: Multidetector CT imaging of the neck was performed using the standard protocol during bolus administration of intravenous contrast. Multiplanar CT image reconstructions and MIPs were obtained to evaluate the vascular anatomy. Carotid stenosis measurements (when applicable) are obtained utilizing NASCET criteria, using the distal internal carotid diameter as the denominator.  CONTRAST:  5mL OMNIPAQUE IOHEXOL 350 MG/ML SOLN  COMPARISON:  MRI of the brain April 02, 2015  FINDINGS: Normal appearance of the thoracic arch, normal branch pattern. The origins of the innominate, left Common carotid artery and subclavian artery  are widely patent. Mild eccentric calcific atherosclerosis of LEFT subclavian artery origin.  Bilateral Common carotid arteries are widely patent, coursing in a straight line fashion. Normal appearance of the carotid bifurcations without hemodynamically significant stenosis by NASCET criteria. Normal appearance of the included internal carotid arteries.  RIGHT vertebral artery is dominant. Normal appearance of the vertebral arteries, which appear widely patent.  No hemodynamically significant stenosis by NASCET criteria. No dissection, no pseudoaneurysm. No abnormal luminal irregularity. No contrast extravasation.  Included view of the chest  demonstrates calcified and noncalcified mediastinal lymphadenopathy measuring up to 15 mm short axis. Asymmetric fullness RIGHT base of tongue effaces the RIGHT vallecula. Moderate to severe lower cervical disc height loss, endplate sclerosis uncovertebral hypertrophy consistent with degenerative discs. No destructive bony lesions. Upper thoracic levoscoliosis.  Multiple LEFT upper lobe pulmonary nodules measuring up to 7 mm, partially imaged.  IMPRESSION: No hemodynamically significant stenosis within the cervical vessels.  Partially imaged LEFT pulmonary nodules and mediastinal lymphadenopathy for which dedicated CT of the chest is recommended on a nonemergent basis.  Asymmetric fullness the RIGHT base of tongue, recommend direct inspection.   Electronically Signed   By: Elon Alas M.D.   On: 04/04/2015 05:53   [name] Wo Contrast  04/02/2015   CLINICAL DATA:  79 old hypertensive male presenting with left visual loss. Subsequent encounter.  EXAM: MRI HEAD WITHOUT CONTRAST  MRA HEAD WITHOUT CONTRAST  TECHNIQUE: Multiplanar, multiecho pulse sequences of the brain and surrounding structures were obtained without intravenous contrast. Angiographic images of the head were obtained using MRA technique without contrast.  COMPARISON:  04/02/2015 head CT.  No comparison brain MR.  FINDINGS: MRI HEAD FINDINGS  Acute nonhemorrhagic infarct involving portions of the right occipital lobe and right thalamus.  No intracranial mass lesion noted on this unenhanced exam.  No intracranial hemorrhage.  No hydrocephalus.  Cerebellar tonsils minimally low lying within range of limits.  Post lens replacement otherwise orbital structures unremarkable.  Pituitary and pineal region within normal limits.  Complex polypoid opacification inferior aspect left maxillary sinus.  MRA HEAD FINDINGS  Ectatic dominant right vertebral artery.  Left vertebral artery is small and irregularity after the takeoff of the left posterior  inferior cerebral artery.  Irregular prominent size left posterior inferior cerebellar artery. Poor delineation of the right posterior inferior cerebellar artery.  Mild focal narrowing left lateral aspect of the proximal basilar artery. Mild smooth narrowing mid basilar artery.  High-grade long segment stenosis majority of the right posterior cerebral artery.  High grade focal stenosis P2 segment left posterior cerebral artery.  Ectatic internal carotid arteries bilaterally.  Anterior circulation without medium or large size vessel significant stenosis or occlusion.  Probable artifact left cavernous segment without aneurysm noted.  IMPRESSION: MRI HEAD  Acute nonhemorrhagic infarct involving portions of the right occipital lobe and right thalamus.  Complex polypoid opacification inferior aspect left maxillary sinus.  MRA HEAD  Intracranial atherosclerotic type changes most notable posterior circulation including:  High-grade long segment stenosis majority of the right posterior cerebral artery.  High grade focal stenosis P2 segment left posterior cerebral artery  Please see above for further detail.   Electronically Signed   By: Genia Del M.D.   On: 04/02/2015 19:17   Mr Brain Wo Contrast  04/02/2015   CLINICAL DATA:  41 old hypertensive male presenting with left visual loss. Subsequent encounter.  EXAM: MRI HEAD WITHOUT CONTRAST  MRA HEAD WITHOUT CONTRAST  TECHNIQUE: Multiplanar, multiecho pulse sequences of the  brain and surrounding structures were obtained without intravenous contrast. Angiographic images of the head were obtained using MRA technique without contrast.  COMPARISON:  04/02/2015 head CT.  No comparison brain MR.  FINDINGS: MRI HEAD FINDINGS  Acute nonhemorrhagic infarct involving portions of the right occipital lobe and right thalamus.  No intracranial mass lesion noted on this unenhanced exam.  No intracranial hemorrhage.  No hydrocephalus.  Cerebellar tonsils minimally low lying  within range of limits.  Post lens replacement otherwise orbital structures unremarkable.  Pituitary and pineal region within normal limits.  Complex polypoid opacification inferior aspect left maxillary sinus.  MRA HEAD FINDINGS  Ectatic dominant right vertebral artery.  Left vertebral artery is small and irregularity after the takeoff of the left posterior inferior cerebral artery.  Irregular prominent size left posterior inferior cerebellar artery. Poor delineation of the right posterior inferior cerebellar artery.  Mild focal narrowing left lateral aspect of the proximal basilar artery. Mild smooth narrowing mid basilar artery.  High-grade long segment stenosis majority of the right posterior cerebral artery.  High grade focal stenosis P2 segment left posterior cerebral artery.  Ectatic internal carotid arteries bilaterally.  Anterior circulation without medium or large size vessel significant stenosis or occlusion.  Probable artifact left cavernous segment without aneurysm noted.  IMPRESSION: MRI HEAD  Acute nonhemorrhagic infarct involving portions of the right occipital lobe and right thalamus.  Complex polypoid opacification inferior aspect left maxillary sinus.  MRA HEAD  Intracranial atherosclerotic type changes most notable posterior circulation including:  High-grade long segment stenosis majority of the right posterior cerebral artery.  High grade focal stenosis P2 segment left posterior cerebral artery  Please see above for further detail.   Electronically Signed   By: Genia Del M.D.   On: 04/02/2015 19:17    Microbiology: No results found for this or any previous visit (from the past 240 hour(s)).   Labs: Basic Metabolic Panel:  Recent Labs Lab 04/02/15 1247 04/03/15 0427 04/04/15 0407  NA 138 137 139  K 4.0 3.8 4.1  CL 104 103 104  CO2 27 27 28   GLUCOSE 99 101* 101*  BUN 20 19 23*  CREATININE 1.46* 1.46* 1.41*  CALCIUM 9.9 9.2 9.0   Liver Function Tests: No results for  input(s): AST, ALT, ALKPHOS, BILITOT, PROT, ALBUMIN in the last 168 hours. No results for input(s): LIPASE, AMYLASE in the last 168 hours. No results for input(s): AMMONIA in the last 168 hours. CBC:  Recent Labs Lab 04/02/15 1247 04/03/15 0427  WBC 7.9 7.6  NEUTROABS 4.1  --   HGB 16.3 14.9  HCT 48.1 44.7  MCV 94.1 94.1  PLT 235 189   Cardiac Enzymes: No results for input(s): CKTOTAL, CKMB, CKMBINDEX, TROPONINI in the last 168 hours. BNP: BNP (last 3 results) No results for input(s): BNP in the last 8760 hours.  ProBNP (last 3 results) No results for input(s): PROBNP in the last 8760 hours.  CBG: No results for input(s): GLUCAP in the last 168 hours.     Signed:  Serafino Burciaga A  [facility] 04/04/2015, 11:44 AM

## 2015-04-06 ENCOUNTER — Emergency Department (HOSPITAL_COMMUNITY)
Admission: EM | Admit: 2015-04-06 | Discharge: 2015-04-06 | Disposition: A | Discharge status: Home / Self Care | Payer: Medicare HMO | Attending: Emergency Medicine | Admitting: Emergency Medicine

## 2015-04-06 ENCOUNTER — Emergency Department (HOSPITAL_COMMUNITY): Payer: Medicare HMO

## 2015-04-06 ENCOUNTER — Other Ambulatory Visit: Payer: Self-pay | Admitting: Neurology

## 2015-04-06 ENCOUNTER — Encounter (HOSPITAL_COMMUNITY): Payer: Self-pay | Admitting: Emergency Medicine

## 2015-04-06 DIAGNOSIS — R200 Anesthesia of skin: Secondary | ICD-10-CM | POA: Insufficient documentation

## 2015-04-06 DIAGNOSIS — M1990 Unspecified osteoarthritis, unspecified site: Secondary | ICD-10-CM | POA: Diagnosis not present

## 2015-04-06 DIAGNOSIS — Z7902 Long term (current) use of antithrombotics/antiplatelets: Secondary | ICD-10-CM | POA: Insufficient documentation

## 2015-04-06 DIAGNOSIS — Z79899 Other long term (current) drug therapy: Secondary | ICD-10-CM | POA: Diagnosis not present

## 2015-04-06 DIAGNOSIS — R079 Chest pain, unspecified: Secondary | ICD-10-CM | POA: Insufficient documentation

## 2015-04-06 DIAGNOSIS — Z87891 Personal history of nicotine dependence: Secondary | ICD-10-CM | POA: Insufficient documentation

## 2015-04-06 DIAGNOSIS — R51 Headache: Secondary | ICD-10-CM | POA: Insufficient documentation

## 2015-04-06 DIAGNOSIS — R202 Paresthesia of skin: Secondary | ICD-10-CM | POA: Insufficient documentation

## 2015-04-06 DIAGNOSIS — Z7982 Long term (current) use of aspirin: Secondary | ICD-10-CM | POA: Insufficient documentation

## 2015-04-06 DIAGNOSIS — Z8673 Personal history of transient ischemic attack (TIA), and cerebral infarction without residual deficits: Secondary | ICD-10-CM | POA: Diagnosis not present

## 2015-04-06 DIAGNOSIS — Z85828 Personal history of other malignant neoplasm of skin: Secondary | ICD-10-CM | POA: Diagnosis not present

## 2015-04-06 DIAGNOSIS — I10 Essential (primary) hypertension: Secondary | ICD-10-CM | POA: Insufficient documentation

## 2015-04-06 DIAGNOSIS — H538 Other visual disturbances: Secondary | ICD-10-CM | POA: Diagnosis not present

## 2015-04-06 DIAGNOSIS — I6350 Cerebral infarction due to unspecified occlusion or stenosis of unspecified cerebral artery: Secondary | ICD-10-CM

## 2015-04-06 LAB — BASIC METABOLIC PANEL
Anion gap: 10 (ref 5–15)
BUN: 28 mg/dL — ABNORMAL HIGH (ref 6–20)
CALCIUM: 9.8 mg/dL (ref 8.9–10.3)
CO2: 24 mmol/L (ref 22–32)
CREATININE: 1.49 mg/dL — AB (ref 0.61–1.24)
Chloride: 106 mmol/L (ref 101–111)
GFR calc Af Amer: 53 mL/min — ABNORMAL LOW (ref >60)
GFR calc non Af Amer: 46 mL/min — ABNORMAL LOW (ref >60)
GLUCOSE: 103 mg/dL — AB (ref 65–99)
Potassium: 4.5 mmol/L (ref 3.5–5.1)
Sodium: 140 mmol/L (ref 135–145)

## 2015-04-06 LAB — CBC
HCT: 47.2 % (ref 39.0–52.0)
HEMOGLOBIN: 15.7 g/dL (ref 13.0–17.0)
MCH: 31.4 pg (ref 26.0–34.0)
MCHC: 33.3 g/dL (ref 30.0–36.0)
MCV: 94.4 fL (ref 78.0–100.0)
PLATELETS: 201 10*3/uL (ref 150–400)
RBC: 5 MIL/uL (ref 4.22–5.81)
RDW: 13 % (ref 11.5–15.5)
WBC: 8.7 10*3/uL (ref 4.0–10.5)

## 2015-04-06 LAB — I-STAT TROPONIN, ED
TROPONIN I, POC: 0.01 ng/mL (ref 0.00–0.08)
Troponin i, poc: 0.01 ng/mL (ref 0.00–0.08)

## 2015-04-06 MED ORDER — LEVOFLOXACIN 750 MG PO TABS: 750.0000 mg | ORAL_TABLET | Freq: Every day | ORAL | Status: DC

## 2015-04-06 MED ORDER — FAMOTIDINE 20 MG PO TABS: 20.0000 mg | ORAL_TABLET | Freq: Two times a day (BID) | ORAL | Status: DC

## 2015-04-06 MED ORDER — ALPRAZOLAM 0.5 MG PO TABS: 0.5000 mg | ORAL_TABLET | Freq: Three times a day (TID) | ORAL | Status: DC | PRN

## 2015-04-06 NOTE — ED Notes (Signed)
Pt is in stable condition upon d/c and is escorted from ED via wheelchair. 

## 2015-04-06 NOTE — ED Notes (Signed)
Pt from home for eval of left sided cp that began this morning while in bed, pt states pain has become intermittent. Pt also reports bilateral arm "tingling" that started as well, states he feels that this is indigestion and has recently been taken off of acid reflux meds due to plavix. Pt states recently discharged for stroke with vision deficits after. No neuro deficits noted in triage.

## 2015-04-06 NOTE — ED Provider Notes (Signed)
CSN: [id number]     Arrival date & time 04/06/15  1310 History   First MD Initiated Contact with Patient 04/06/15 1401     Chief Complaint  Patient presents with  . Chest Pain  . Numbness     (Consider location/radiation/quality/duration/timing/severity/associated sxs/prior Treatment) HPI Comments: 69 year old male with a past medical history of posterior circulation stroke, hypertension, GERD and arthritis presenting after an episode of left-sided chest pain beginning at 8:30 AM today while laying in bed. Pain was sharp and pressure-like, gradually subsiding on its own over about an hour. At the same time, he developed bilateral arm tingling that gradually moved to only the right side, and reports "not feeling right" in his head. He then had breakfast and went to sit outside, and when sitting outside, he suddenly developed a very sharp headache in darkening of his vision in his right eye. The darkening of his vision lasted about 15-20 minutes and started to subside. All of his symptoms have now pretty much subsided other than some mild tingling in both of his hands. He was discharged from the hospital 2 days ago after having a posterior circulation stroke. He believes the actual stroke was about one week ago when he noticed he lost his peripheral vision on his right eye. He still has some mild vision deficits, however today's vision deficit was different. He believes the chest pain is due to stress, and the more he thought about the chest pain, and the more intense and became. He was taken off the omeprazole due to the plavix and feels as if he has heartburn. Denies fever, nausea, vomiting, cough, confusion, speech changes, extremity numbness or weakness. While in the hospital, both his carotid arteries and cerebral arteries are noted to be patent.  Patient is a 69 y.o. male presenting with chest pain. The history is provided by the patient, the spouse and medical records.  Chest Pain Associated  symptoms: headache     Past Medical History  Diagnosis Date  . Hypertension   . Stroke 04/02/2015  . GERD (gastroesophageal reflux disease)   . Headache   . Arthritis     KNEES   . Skin cancer     hx of   Past Surgical History  Procedure Laterality Date  . Hernia repair    . Knee surgery Right 12/2014  . Skin cancer excision  12/2014  . Cataract extraction    . Colonoscopy     No family history on file. Social History  Substance Use Topics  . Smoking status: Former Smoker    Quit date: 08/01/1986  . Smokeless tobacco: Never Used  . Alcohol Use: Yes     Comment: occasional    Review of Systems  Eyes: Positive for visual disturbance.  Cardiovascular: Positive for chest pain.  Neurological: Positive for headaches.       + Tingling in  BL arms.  All other systems reviewed and are negative.     Allergies  Hydrocodone  Home Medications   Prior to Admission medications   Medication Sig Start Date End Date Taking? Authorizing Provider  aspirin 81 MG chewable tablet Chew 81 mg by mouth daily.   Yes Historical Provider, MD  atorvastatin (LIPITOR) 80 MG tablet Take 80 mg by mouth daily.   Yes Historical Provider, MD  butalbital-acetaminophen-caffeine (FIORICET, ESGIC) 50-325-40 MG per tablet Take 1 tablet by mouth every 8 (eight) hours as needed for headache. 04/04/15  Yes Verlee Monte, MD  clopidogrel (PLAVIX) 75 MG tablet  Take 75 mg by mouth daily.   Yes Historical Provider, MD  hydrochlorothiazide (HYDRODIURIL) 25 MG tablet Take 25 mg by mouth daily.   Yes Historical Provider, MD  irbesartan (AVAPRO) 300 MG tablet Take 300 mg by mouth daily.   Yes Historical Provider, MD  temazepam (RESTORIL) 15 MG capsule Take 30 mg by mouth at bedtime as needed for sleep.   Yes Historical Provider, MD  ALPRAZolam Duanne Moron) 0.5 MG tablet Take 1 tablet (0.5 mg total) by mouth 3 (three) times daily as needed for anxiety. 04/06/15   Jesi Jurgens M Angell Honse, PA-C  famotidine (PEPCID) 20 MG tablet Take 1  tablet (20 mg total) by mouth 2 (two) times daily. 04/06/15   Autumne Kallio M Demoni Parmar, PA-C  fluticasone (FLONASE) 50 MCG/ACT nasal spray Place 1 spray into both nostrils daily as needed for allergies or rhinitis.    Historical Provider, MD  hydrocortisone (ANUSOL-HC) 2.5 % rectal cream Place 1 application rectally daily as needed for hemorrhoids or itching.    Historical Provider, MD  levofloxacin (LEVAQUIN) 750 MG tablet Take 1 tablet (750 mg total) by mouth daily. X 7 days 04/06/15   Hessie Diener Rahkim Rabalais, PA-C   BP 118/72 mmHg  Pulse 72  Temp(Src) 97.5 F (36.4 C) (Oral)  Resp 16  Ht 6\' 2"  (1.88 m)  Wt 230 lb (104.327 kg)  BMI 29.52 kg/m2  SpO2 96% Physical Exam  Constitutional: He is oriented to person, place, and time. He appears well-developed and well-nourished. No distress.  HENT:  Head: Normocephalic and atraumatic.  Eyes: Conjunctivae and EOM are normal. Pupils are equal, round, and reactive to light.  Neck: Normal range of motion. Neck supple.  Cardiovascular: Normal rate, regular rhythm and normal heart sounds.   Pulmonary/Chest: Effort normal and breath sounds normal.  Abdominal: Soft. Bowel sounds are normal. He exhibits no distension. There is no tenderness.  Musculoskeletal: Normal range of motion. He exhibits no edema.  Neurological: He is alert and oriented to person, place, and time. He has normal strength. No cranial nerve deficit or sensory deficit. Coordination normal. GCS eye subscore is 4. GCS verbal subscore is 5. GCS motor subscore is 6.  No pronator drift. Speech fluent, goal oriented. Moves extremities without ataxia.  Skin: Skin is warm and dry.  Psychiatric: He has a normal mood and affect. His behavior is normal.  Nursing note and vitals reviewed.   ED Course  Procedures (including critical care time) Labs Review Labs Reviewed  BASIC METABOLIC PANEL - Abnormal; Notable for the following:    Glucose, Bld 103 (*)    BUN 28 (*)    Creatinine, Ser 1.49 (*)    GFR calc non  Af Amer 46 (*)    GFR calc Af Amer 53 (*)    All other components within normal limits  CBC  I-STAT TROPOININ, ED  Randolm Idol, ED    Imaging Review Dg Chest 2 View  04/06/2015   CLINICAL DATA:  69 year old male with central chest pain and bilateral arm tingling since this morning.  EXAM: CHEST  2 VIEW  COMPARISON:  Chest x-ray 11/19/2007.  FINDINGS: New opacity in the right middle lobe is rather linear in appearance, favored to reflect subsegmental atelectasis. Several small pulmonary nodules again noted throughout the lungs bilaterally, corresponding to partially calcified granulomas based on comparison to prior chest CT 05/22/2008. No pleural effusions. No evidence of pulmonary edema. Heart size is normal. The patient is rotated to the right on today's exam, resulting in distortion  of the mediastinal contours and reduced diagnostic sensitivity and specificity for mediastinal pathology.  IMPRESSION: 1. New opacity in the right middle lobe favored to reflect atelectasis, although underlying airspace consolidation from bronchopneumonia is not excluded. Followup PA and lateral chest X-ray is recommended in 3-4 weeks following trial of antibiotic therapy to ensure resolution and exclude underlying malignancy. 2. Sequela of old granulomatous disease redemonstrated, as above.   Electronically Signed   By: Vinnie Langton M.D.   On: 04/06/2015 14:19   Ct Head Wo Contrast  04/06/2015   CLINICAL DATA:  Recent stroke. Numbness in his and tingling in the hands. Chest pain.  EXAM: CT HEAD WITHOUT CONTRAST  TECHNIQUE: Contiguous axial images were obtained from the base of the skull through the vertex without intravenous contrast.  COMPARISON:  Head CT 04/02/2015  FINDINGS: No acute intracranial hemorrhage. No focal mass lesion. No CT evidence of acute infarction. No midline shift or mass effect. No hydrocephalus. Basilar cisterns are patent.  Paranasal sinuses and  mastoid air cells are clear.  IMPRESSION: 1.  No acute intracranial findings. 2. No change from 04/02/2015.   Electronically Signed   By: Suzy Bouchard M.D.   On: 04/06/2015 15:00   I have personally reviewed and evaluated these images and lab results as part of my medical decision-making.   EKG Interpretation   Date/Time:  Monday April 06 2015 13:17:00 EDT Ventricular Rate:  70 PR Interval:  204 QRS Duration: 150 QT Interval:  406 QTC Calculation: 438 R Axis:   64 Text Interpretation:  Normal sinus rhythm Right bundle branch block No  significant change since last tracing Abnormal ECG Confirmed by Hazle Coca  [phone]) on 04/06/2015 1:19:32 PM      MDM   Final diagnoses:  Left sided chest pain  Paresthesia of both hands   Patient with recent stroke as stated above, presenting with chest pain and resolved visual disturbance and paresthesia. Workup today without any acute findings. Labs unchanged. Had a full workup during his hospital stay as stated above with patent arteries. Had an echo without any concerns of cardiac origin of emboli. Head CT today without acute finding. Asymptomatic in the ED. Doubt cardiac chest pain. Delta troponin negative. No return of symptoms. I spoke with Dr. Janann Colonel with neurology who does not feel another workup is necessary at this time as these are symptoms of the place of the patient's infarct and he is completely back to baseline. Regarding the patient's stress and anxiety which she believes is triggered into his chest pain, will prescribe Xanax for severe anxiety until he can follow-up with his PCP. I spoke with pharmacist about the patient's indigestion as he was taken off the omeprazole. Pharmacy states that the patient can take Pepcid along with the Plavix. Will prescribe this. On chest x-ray, there is a new Eugenio Hoes in the right middle lobe favored to reflect atelectasis, discussed this with patient and he does not have any fever, cough or wheezing. Will prescribe Levaquin (would be Hcap) and advised  the patient to take as he develops a cough, fever, wheezing. Advised follow-up with PCP within one week. Stable for discharge. Return precautions given to both patient and wife who state understanding of plan and are agreeable.  Discussed with attending Dr. Tamera Punt who also evaluated patient and agrees with plan of care.  Carman Ching, PA-C 04/06/15 1717  Malvin Johns, MD 04/12/15 [phone]

## 2015-04-06 NOTE — Progress Notes (Signed)
Hi, Katrina:  I just saw this pt in hospital. Not sure why they scheduled him with Dr. Jaynee Eagles on 04/09/15. He needs to see me in clinic in about 1-2 months. Could you please cancel his appointment with Dr. Jaynee Eagles and schedule with me in about one month? Thanks for your help.  Rosalin Hawking, MD PhD Stroke Neurology 04/06/2015 8:49 AM

## 2015-04-06 NOTE — Discharge Instructions (Signed)
Take Xanax as directed as needed for severe anxiety. Take Pepcid as prescribed. This is safe to take with Plavix. Follow-up with your primary care physician within one week. Return with any worsening symptoms. Take levaquin if you develop fever, cough or wheezing.  Paresthesia Paresthesia is an abnormal burning or prickling sensation. This sensation is generally felt in the hands, arms, legs, or feet. However, it may occur in any part of the body. It is usually not painful. The feeling may be described as:  Tingling or numbness.  "Pins and needles."  Skin crawling.  Buzzing.  Limbs "falling asleep."  Itching. Most people experience temporary (transient) paresthesia at some time in their lives. CAUSES  Paresthesia may occur when you breathe too quickly (hyperventilation). It can also occur without any apparent cause. Commonly, paresthesia occurs when pressure is placed on a nerve. The feeling quickly goes away once the pressure is removed. For some people, however, paresthesia is a long-lasting (chronic) condition caused by an underlying disorder. The underlying disorder may be:  A traumatic, direct injury to nerves. Examples include a:  Broken (fractured) neck.  Fractured skull.  A disorder affecting the brain and spinal cord (central nervous system). Examples include:  Transverse myelitis.  Encephalitis.  Transient ischemic attack.  Multiple sclerosis.  Stroke.  Tumor or blood vessel problems, such as an arteriovenous malformation pressing against the brain or spinal cord.  A condition that damages the peripheral nerves (peripheral neuropathy). Peripheral nerves are not part of the brain and spinal cord. These conditions include:  Diabetes.  Peripheral vascular disease.  Nerve entrapment syndromes, such as carpal tunnel syndrome.  Shingles.  Hypothyroidism.  Vitamin B12 deficiencies.  Alcoholism.  Heavy metal poisoning (lead, arsenic).  Rheumatoid  arthritis.  Systemic lupus erythematosus. DIAGNOSIS  Your caregiver will attempt to find the underlying cause of your paresthesia. Your caregiver may:  Take your medical history.  Perform a physical exam.  Order various lab tests.  Order imaging tests. TREATMENT  Treatment for paresthesia depends on the underlying cause. HOME CARE INSTRUCTIONS  Avoid drinking alcohol.  You may consider massage or acupuncture to help relieve your symptoms.  Keep all follow-up appointments as directed by your caregiver. SEEK IMMEDIATE MEDICAL CARE IF:   You feel weak.  You have trouble walking or moving.  You have problems with speech or vision.  You feel confused.  You cannot control your bladder or bowel movements.  You feel numbness after an injury.  You faint.  Your burning or prickling feeling gets worse when walking.  You have pain, cramps, or dizziness.  You develop a rash. MAKE SURE YOU:  Understand these instructions.  Will watch your condition.  Will get help right away if you are not doing well or get worse. Document Released: 07/08/2002 Document Revised: 10/10/2011 Document Reviewed: 04/08/2011 [facility] Patient Information 2015 [place], [place]. This information is not intended to replace advice given to you by your health care provider. Make sure you discuss any questions you have with your health care provider.  Chest Pain (Nonspecific) It is often hard to give a specific diagnosis for the cause of chest pain. There is always a chance that your pain could be related to something serious, such as a heart attack or a blood clot in the lungs. You need to follow up with your health care provider for further evaluation. CAUSES   Heartburn.  Pneumonia or bronchitis.  Anxiety or stress.  Inflammation around your heart (pericarditis) or lung (pleuritis or pleurisy).  A blood clot in the lung.  A collapsed lung (pneumothorax). It can develop suddenly on its own  (spontaneous pneumothorax) or from trauma to the chest.  Shingles infection (herpes zoster virus). The chest wall is composed of bones, muscles, and cartilage. Any of these can be the source of the pain.  The bones can be bruised by injury.  The muscles or cartilage can be strained by coughing or overwork.  The cartilage can be affected by inflammation and become sore (costochondritis). DIAGNOSIS  Lab tests or other studies may be needed to find the cause of your pain. Your health care provider may have you take a test called an ambulatory electrocardiogram (ECG). An ECG records your heartbeat patterns over a 24-hour period. You may also have other tests, such as:  Transthoracic echocardiogram (TTE). During echocardiography, sound waves are used to evaluate how blood flows through your heart.  Transesophageal echocardiogram (TEE).  Cardiac monitoring. This allows your health care provider to monitor your heart rate and rhythm in real time.  Holter monitor. This is a portable device that records your heartbeat and can help diagnose heart arrhythmias. It allows your health care provider to track your heart activity for several days, if needed.  Stress tests by exercise or by giving medicine that makes the heart beat faster. TREATMENT   Treatment depends on what may be causing your chest pain. Treatment may include:  Acid blockers for heartburn.  Anti-inflammatory medicine.  Pain medicine for inflammatory conditions.  Antibiotics if an infection is present.  You may be advised to change lifestyle habits. This includes stopping smoking and avoiding alcohol, caffeine, and chocolate.  You may be advised to keep your head raised (elevated) when sleeping. This reduces the chance of acid going backward from your stomach into your esophagus. Most of the time, nonspecific chest pain will improve within 2-3 days with rest and mild pain medicine.  HOME CARE INSTRUCTIONS   If antibiotics  were prescribed, take them as directed. Finish them even if you start to feel better.  For the next few days, avoid physical activities that bring on chest pain. Continue physical activities as directed.  Do not use any tobacco products, including cigarettes, chewing tobacco, or electronic cigarettes.  Avoid drinking alcohol.  Only take medicine as directed by your health care provider.  Follow your health care provider's suggestions for further testing if your chest pain does not go away.  Keep any follow-up appointments you made. If you do not go to an appointment, you could develop lasting (chronic) problems with pain. If there is any problem keeping an appointment, call to reschedule. SEEK MEDICAL CARE IF:   Your chest pain does not go away, even after treatment.  You have a rash with blisters on your chest.  You have a fever. SEEK IMMEDIATE MEDICAL CARE IF:   You have increased chest pain or pain that spreads to your arm, neck, jaw, back, or abdomen.  You have shortness of breath.  You have an increasing cough, or you cough up blood.  You have severe back or abdominal pain.  You feel nauseous or vomit.  You have severe weakness.  You faint.  You have chills. This is an emergency. Do not wait to see if the pain will go away. Get medical help at once. Call your local emergency services (911 in [place] Do not drive yourself to the hospital. MAKE SURE YOU:   Understand these instructions.  Will watch your condition.  Will get help  if you are not doing well or get worse. °Document Released: 04/27/2005 Document Revised: 07/23/2013 Document Reviewed: 02/21/2008 °[employer]® Patient Information ©2015 [employer]. This information is not intended to replace advice given to you by your health care provider. Make sure you discuss any questions you have with your health care provider. ° °

## 2015-04-06 NOTE — ED Notes (Signed)
Spoke wikth Dr. Reather Converse in regards to head CT for pt with c/o bilateral arm numbness with no neuro deficits states pt to be assessed by MD first.

## 2015-04-07 ENCOUNTER — Telehealth: Payer: Self-pay

## 2015-04-07 NOTE — Telephone Encounter (Signed)
Rn talk to patients wife Neoma Laming about rescheduling patients appt from Dr. Jaynee Eagles to Dr. Erlinda Hong. Pt was discharge from [facility] last week. Rn explain to pts wife that is preferred for patients to see the stroke doctor who does rounds in the hospital. Pts appt was cancelled with Dr. Jaynee Eagles on September 8. He was reschedule with Dr.Xu on May 06, 2015 at 0930 with Dr. Erlinda Hong at [facility].

## 2015-04-07 NOTE — Telephone Encounter (Signed)
Pts wife Neoma Laming agreered to the May 06, 2015 appt at 0930. Nurse explain to wife to arrive 15 minutes early for check in.

## 2015-04-08 ENCOUNTER — Telehealth: Payer: Self-pay | Admitting: Neurology

## 2015-04-08 DIAGNOSIS — I63431 Cerebral infarction due to embolism of right posterior cerebral artery: Secondary | ICD-10-CM

## 2015-04-08 NOTE — Telephone Encounter (Signed)
I called [facility] to schedule his TEE and was told that we need cardiology referral first. Therefore, I am referring him to see Dr. Haroldine Laws to do the TEE. Please let the pt know. Thanks.  Rosalin Hawking, MD PhD Stroke Neurology 04/08/2015 3:40 PM  Orders Placed This Encounter  Procedures  . Ambulatory referral to Cardiology    Referral Priority:  Routine    Referral Type:  Consultation    Referral Reason:  Specialty Services Required    Referred to Provider:  Jolaine Artist, MD    Requested Specialty:  Cardiology    Number of Visits Requested:  1

## 2015-04-08 NOTE — Telephone Encounter (Signed)
Rn call patients wife Neoma Laming to discuss her husband echo tee that needs to be schedule. Rn explain that his tee will get schedule and sometimes it requires a prior authorization.Pts wife stated she was okay with waiting until everything gets approve.

## 2015-04-08 NOTE — Telephone Encounter (Signed)
Pt's wife called and states that a TEE was scheduled for 9/5 , but due to the holiday it was not done. The wife and the pt both want to have this done as soon as possible. She is also consernced that it might be an issue with his heart and needs to know if the pt should be seen by a cardiologist. If so she would like a referral , Dr. Pernell Dupre if her first choice, or Dr. Haroldine Laws.  Please call and advise [phone].

## 2015-04-09 ENCOUNTER — Inpatient Hospital Stay (HOSPITAL_COMMUNITY): Admission: RE | Admit: 2015-04-09 | Payer: Self-pay | Source: Ambulatory Visit

## 2015-04-09 ENCOUNTER — Ambulatory Visit: Payer: Medicare HMO | Admitting: Neurology

## 2015-04-09 NOTE — Telephone Encounter (Signed)
Called and spoke w/ pt. Told him Dr Erlinda Hong called [name] to schedule TEE but was told he needs a cardiology referral first. He placed referral for him to see Dr. Glori Bickers at [facility] in [place].  Phone number to office: Phone: [phone]  Pt verbalized understanding.

## 2015-04-13 ENCOUNTER — Telehealth: Payer: Self-pay | Admitting: Neurology

## 2015-04-13 ENCOUNTER — Other Ambulatory Visit: Payer: Self-pay | Admitting: Neurology

## 2015-04-13 DIAGNOSIS — I639 Cerebral infarction, unspecified: Secondary | ICD-10-CM

## 2015-04-21 ENCOUNTER — Encounter: Payer: Self-pay | Admitting: *Deleted

## 2015-04-27 ENCOUNTER — Encounter: Payer: Self-pay | Admitting: Cardiology

## 2015-04-27 ENCOUNTER — Ambulatory Visit (INDEPENDENT_AMBULATORY_CARE_PROVIDER_SITE_OTHER): Payer: Medicare HMO | Admitting: Cardiology

## 2015-04-27 ENCOUNTER — Encounter: Payer: Self-pay | Admitting: *Deleted

## 2015-04-27 VITALS — BP 128/86 | HR 74 | Ht 74.0 in | Wt 232.4 lb

## 2015-04-27 DIAGNOSIS — Z01812 Encounter for preprocedural laboratory examination: Secondary | ICD-10-CM

## 2015-04-27 DIAGNOSIS — I639 Cerebral infarction, unspecified: Secondary | ICD-10-CM | POA: Diagnosis not present

## 2015-04-27 LAB — CBC WITH DIFFERENTIAL/PLATELET
Basophils Absolute: 0 10*3/uL (ref 0.0–0.1)
Basophils Relative: 0.5 % (ref 0.0–3.0)
Eosinophils Absolute: 0.4 10*3/uL (ref 0.0–0.7)
Eosinophils Relative: 4.5 % (ref 0.0–5.0)
HCT: 45.7 % (ref 39.0–52.0)
Hemoglobin: 15.1 g/dL (ref 13.0–17.0)
Lymphocytes Relative: 20.2 % (ref 12.0–46.0)
Lymphs Abs: 2 10*3/uL (ref 0.7–4.0)
MCHC: 33.1 g/dL (ref 30.0–36.0)
MCV: 93.3 fl (ref 78.0–100.0)
Monocytes Absolute: 1.2 10*3/uL — ABNORMAL HIGH (ref 0.1–1.0)
Monocytes Relative: 12.6 % — ABNORMAL HIGH (ref 3.0–12.0)
Neutro Abs: 6.1 10*3/uL (ref 1.4–7.7)
Neutrophils Relative %: 62.2 % (ref 43.0–77.0)
Platelets: 221 10*3/uL (ref 150.0–400.0)
RBC: 4.89 Mil/uL (ref 4.22–5.81)
RDW: 13.2 % (ref 11.5–15.5)
WBC: 9.8 10*3/uL (ref 4.0–10.5)

## 2015-04-27 LAB — BASIC METABOLIC PANEL
BUN: 34 mg/dL — AB (ref 6–23)
CHLORIDE: 103 meq/L (ref 96–112)
CO2: 28 mEq/L (ref 19–32)
CREATININE: 1.5 mg/dL (ref 0.40–1.50)
Calcium: 9.4 mg/dL (ref 8.4–10.5)
GFR: 49.27 mL/min — ABNORMAL LOW (ref >60.00)
GLUCOSE: 87 mg/dL (ref 70–99)
Potassium: 4.4 mEq/L (ref 3.5–5.1)
Sodium: 139 mEq/L (ref 135–145)

## 2015-04-27 NOTE — Progress Notes (Signed)
Electrophysiology Office Note   Date:  04/27/2015   ID:  NIJAH King, DOB Jul 08, 1946, MRN [id number]  PCP:  Haywood Pao, MD  Primary Electrophysiologist: Will Meredith Leeds, MD    Chief Complaint  Patient presents with  . disscus loop  . Chest Pain     History of Present Illness: [name] is a 69 y.o. male who presents today for electrophysiology evaluation.  He has a history of posterior circulation CVA, HTN, HLD, and RBBB.  He presented to the hospital on 9/1 with visual loss.  He was diagnosed with posterior circulation stroke with MRI showing acute nonhemorrhagic infarct involving portions of the right occipital lobe and right thalamus.  A TTE was done and showed no evidence of embolic source and an EF of 55%.  Neurology recommended TEE and LINQ should TEE be negative for source of embolism.    Today, he denies symptoms of palpitations, chest pain, shortness of breath, orthopnea, PND, lower extremity edema, claudication, dizziness, presyncope, syncope, bleeding, or neurologic sequela. The patient is tolerating medications without difficulties and is otherwise without complaint today.  He does say that he is fatigued.  His vision has improved but unfortunatly is not back to nrmal.  He continues to have problems with his left visual fields.  Otherwise, he feels anxious which manifests as tingling hands, anxiety, and chest pain.  He has been given antianxiety medications which help and just put him to sleep.   Past Medical History  Diagnosis Date  . Headache   . Arthritis     KNEES   . Skin cancer     hx of  . Cerebrovascular accident involving posterior circulation 04/02/2015  . Essential hypertension   . HLD (hyperlipidemia)   . Stroke with cerebral ischemia   . ABDOMINAL PAIN-LLQ 05/19/2008    Qualifier: Diagnosis of  By: [name] MD, [place] CHEST XRAY 06/02/2008    Qualifier: Diagnosis of  By: [name] MD, [name]   . GERD 05/19/2008    Qualifier:  Diagnosis of  By: [name] MD, [place], LEFT 05/29/2008    Qualifier: Diagnosis of  By: [name] MD, [name]   . Nonspecific (abnormal) findings on radiological and other examination of body structure 06/02/2008    Qualifier: Diagnosis of By: [name] MD, [name]  Problem list entry automatically replaced. Please review for accuracy.  . History of right bundle branch block (RBBB)   . Seasonal allergies    Past Surgical History  Procedure Laterality Date  . Hernia repair    . Knee surgery Right 12/2014  . Skin cancer excision  12/2014  . Cataract extraction    . Colonoscopy    . Appendectomy       Current Outpatient Prescriptions  Medication Sig Dispense Refill  . ALPRAZolam (XANAX) 0.5 MG tablet Take 1 tablet (0.5 mg total) by mouth 3 (three) times daily as needed for anxiety. 10 tablet 0  . aspirin 81 MG chewable tablet Chew 81 mg by mouth daily.    Marland Kitchen atorvastatin (LIPITOR) 80 MG tablet Take 80 mg by mouth daily.    . butalbital-acetaminophen-caffeine (FIORICET, ESGIC) 50-325-40 MG per tablet Take 1 tablet by mouth every 8 (eight) hours as needed for headache. 45 tablet 0  . clopidogrel (PLAVIX) 75 MG tablet Take 75 mg by mouth daily.    . famotidine (PEPCID) 20 MG tablet Take 1 tablet (20 mg total) by mouth 2 (two)  times daily. 60 tablet 0  . fluticasone (FLONASE) 50 MCG/ACT nasal spray Place 1 spray into both nostrils daily as needed for allergies or rhinitis.    . hydrochlorothiazide (HYDRODIURIL) 25 MG tablet Take 25 mg by mouth daily.    . hydrocortisone (ANUSOL-HC) 2.5 % rectal cream Place 1 application rectally daily as needed for hemorrhoids or itching.    . irbesartan (AVAPRO) 300 MG tablet Take 300 mg by mouth daily.    . temazepam (RESTORIL) 15 MG capsule Take 30 mg by mouth at bedtime as needed for sleep.     No current facility-administered medications for this visit.    Allergies:   Hydrocodone   Social History:  The patient  reports that he quit smoking  about 28 years ago. He has never used smokeless tobacco. He reports that he drinks alcohol. He reports that he does not use illicit drugs.   Family History:  The patient's family history includes Diabetes in his mother; Emphysema in his father; Kidney disease in his mother; Prostate cancer in his maternal uncle. There is no history of CAD.    ROS:  Please see the history of present illness.   Otherwise, review of systems is positive for visual disturbance, shortness of breath with activity, dizziness, headaches, chest pressure and fatigue.   All other systems are reviewed and negative.    PHYSICAL EXAM: VS:  BP 128/86 mmHg  Pulse 74  Ht 6\' 2"  (1.88 m)  Wt 232 lb 6.4 oz (105.416 kg)  BMI 29.83 kg/m2 , BMI Body mass index is 29.83 kg/(m^2). GEN: Well nourished, well developed, in no acute distress HEENT: normal Neck: no JVD, carotid bruits, or masses Cardiac: RRR; no murmurs, rubs, or gallops,no edema  Respiratory:  clear to auscultation bilaterally, normal work of breathing GI: soft, nontender, nondistended, + BS MS: no deformity or atrophy Skin: warm and dry Neuro:  Strength and sensation are intact Psych: euthymic mood, full affect  EKG:  EKG is ordered today. The ekg ordered today shows sinus rhythm, RBBB, 1 degree AVB, rate 74  Recent Labs: 04/06/2015: BUN 28*; Creatinine, Ser 1.49*; Hemoglobin 15.7; Platelets 201; Potassium 4.5; Sodium 140    Lipid Panel     Component Value Date/Time   CHOL 183 04/03/2015 0421   TRIG 123 04/03/2015 0421   HDL 34* 04/03/2015 0421   CHOLHDL 5.4 04/03/2015 0421   VLDL 25 04/03/2015 0421   LDLCALC 124* 04/03/2015 0421     Wt Readings from Last 3 Encounters:  04/27/15 232 lb 6.4 oz (105.416 kg)  04/06/15 230 lb (104.327 kg)  04/02/15 232 lb (105.235 kg)      Other studies Reviewed: Additional studies/ records that were reviewed today include: TTE 04/02/15  Review of the above records today demonstrates:  - Left ventricle: The cavity  size was normal. There was mild concentric hypertrophy. Systolic function was normal. The estimated ejection fraction was in the range of 50% to 55%. Wall motion was normal; there were no regional wall motion abnormalities. Doppler parameters are consistent with abnormal left ventricular relaxation (grade 1 diastolic dysfunction). - Left atrium: The atrium was mildly dilated. - Right ventricle: The cavity size was mildly dilated. Wall thickness was normal.  Impressions:  - No cardiac source of emboli was indentified.   ASSESSMENT AND PLAN:  1.  Non-hemorrhagic posterior circulation CVA: Unknown cause at this time.  Patient's vision has improved but is still not back to normal.  He has no evidence of arrhythmia witout  palpitations.  He does have fatigue, but this could be related to his recent history or BP meds.   Plan is for TEE evaluation for embolic source and potential LINQ placement should no source of embolism be found.  Current medicines are reviewed at length with the patient today.   The patient does not have concerns regarding his medicines.  The following changes were made today:  none  Labs/ tests ordered today include: LINQ pending TEE  No orders of the defined types were placed in this encounter.     Disposition:   FU with PRN pending results of LINQ  Signed, Will Meredith Leeds, MD  04/27/2015 8:46 AM     [facility] [address] [place] [address] [phone] (office) [phone] (fax)

## 2015-04-27 NOTE — Patient Instructions (Addendum)
Medication Instructions:  Your physician recommends that you continue on your current medications as directed. Please refer to the Current Medication list given to you today.  Labwork: Pre procedure labs today: BMET & CBCD  Testing/Procedures: Your physician has requested that you have a TEE. During a TEE, sound waves are used to create images of your heart. It provides your doctor with information about the size and shape of your heart and how well your heart's chambers and valves are working. In this test, a transducer is attached to the end of a flexible tube that's guided down your throat and into your esophagus (the tube leading from you mouth to your stomach) to get a more detailed image of your heart. You are not awake for the procedure. Please see the instruction sheet given to you today. For further information please visit [email or web address].  Your physician has recommended that you have a loop recorder inserted.  Please see the instruction sheet given to you today for more information.  Follow-Up: You need to schedule a wound check 7-10 days post loop recorder procedure on 05/04/15.    Any Other Special Instructions Will Be Listed Below (If Applicable). Thank you for choosing [facility]!!   Trinidad Curet, RN [phone]    Transesophageal Echocardiogram Transesophageal echocardiography (TEE) is a special type of test that produces images of the heart by using sound waves (echocardiogram). This type of echocardiography can obtain better images of the heart than standard echocardiography. TEE is done by passing a flexible tube down the esophagus. The heart is located in front of the esophagus. Because the heart and esophagus are close to one another, your health care provider can take very clear, detailed pictures of the heart via ultrasound waves. TEE may be done:  If your health care provider needs more information based on standard echocardiography findings.  If you  had a stroke. This might have happened because a clot formed in your heart. TEE can visualize different areas of the heart and check for clots.  To check valve anatomy and function.  To check for infection on the inside of your heart (endocarditis).  To evaluate the dividing wall (septum) of the heart and presence of a hole that did not close after birth (patent foramen ovale or atrial septal defect).  To help diagnose a tear in the wall of the aorta (aortic dissection).  During cardiac valve surgery. This allows the surgeon to assess the valve repair before closing the chest.  During a variety of other cardiac procedures to guide positioning of catheters.  Sometimes before a cardioversion, which is a shock to convert heart rhythm back to normal. LET [facility] CARE PROVIDER KNOW ABOUT:   Any allergies you have.  All medicines you are taking, including vitamins, herbs, eye drops, creams, and over-the-counter medicines.  Previous problems you or members of your family have had with the use of anesthetics.  Any blood disorders you have.  Previous surgeries you have had.  Medical conditions you have.  Swallowing difficulties.  An esophageal obstruction. RISKS AND COMPLICATIONS  Generally, TEE is a safe procedure. However, as with any procedure, complications can occur. Possible complications include an esophageal tear (rupture). BEFORE THE PROCEDURE   Do not eat or drink for 6 hours before the procedure or as directed by your health care provider.  Arrange for someone to drive you home after the procedure. Do not drive yourself home. During the procedure, you will be given medicines that can continue to  make you feel drowsy and can impair your reflexes.  An IV access tube will be started in the arm. PROCEDURE   A medicine to help you relax (sedative) will be given through the IV access tube.  A medicine may be sprayed or gargled to numb the back of the throat.  Your blood  pressure, heart rate, and breathing (vital signs) will be monitored during the procedure.  The TEE probe is a long, flexible tube. The tip of the probe is placed into the back of the mouth, and you will be asked to swallow. This helps to pass the tip of the probe into the esophagus. Once the tip of the probe is in the correct area, your health care provider can take pictures of the heart.  TEE is usually not a painful procedure. You may feel the probe press against the back of the throat. The probe does not enter the trachea and does not affect your breathing. AFTER THE PROCEDURE   You will be in bed, resting, until you have fully returned to consciousness.  When you first awaken, your throat may feel slightly sore and will probably still feel numb. This will improve slowly over time.  You will not be allowed to eat or drink until it is clear that the numbness has improved.  Once you have been able to drink, urinate, and sit on the edge of the bed without feeling sick to your stomach (nausea) or dizzy, you may be cleared to go home.  You should have a friend or family member with you for the next 24 hours after your procedure. Document Released: 10/08/2002 Document Revised: 07/23/2013 Document Reviewed: 01/17/2013 [facility] Patient Information 2015 [place], [place]. This information is not intended to replace advice given to you by your health care provider. Make sure you discuss any questions you have with your health care provider.   Cardiac Diet This diet can help prevent heart disease and stroke. Many factors influence your heart health, including eating and exercise habits. Coronary risk rises a lot with abnormal blood fat (lipid) levels. Cardiac meal planning includes limiting unhealthy fats, increasing healthy fats, and making other small dietary changes. General guidelines are as follows:  Adjust calorie intake to reach and maintain desirable body weight.  Limit total fat intake to  less than 30% of total calories. Saturated fat should be less than 7% of calories.  Saturated fats are found in animal products and in some vegetable products. Saturated vegetable fats are found in coconut oil, cocoa butter, palm oil, and palm kernel oil. Read labels carefully to avoid these products as much as possible. Use butter in moderation. Choose tub margarines and oils that have 2 grams of fat or less. Good cooking oils are canola and olive oils.  Practice low-fat cooking techniques. Do not fry food. Instead, broil, bake, boil, steam, grill, roast on a rack, stir-fry, or microwave it. Other fat reducing suggestions include:  Remove the skin from poultry.  Remove all visible fat from meats.  Skim the fat off stews, soups, and gravies before serving them.  Steam vegetables in water or broth instead of sauting them in fat.  Avoid foods with trans fat (or hydrogenated oils), such as commercially fried foods and commercially baked goods. Commercial shortening and deep-frying fats will contain trans fat.  Increase intake of fruits, vegetables, whole grains, and legumes to replace foods high in fat.  Increase consumption of nuts, legumes, and seeds to at least 4 servings weekly. One serving  of a legume equals  cup, and 1 serving of nuts or seeds equals  cup.  Choose whole grains more often. Have 3 servings per day (a serving is 1 ounce [oz]).  Eat 4 to 5 servings of vegetables per day. A serving of vegetables is 1 cup of raw leafy vegetables;  cup of raw or cooked cut-up vegetables;  cup of vegetable juice.  Eat 4 to 5 servings of fruit per day. A serving of fruit is 1 medium whole fruit;  cup of dried fruit;  cup of fresh, frozen, or canned fruit;  cup of 100% fruit juice.  Increase your intake of dietary fiber to 20 to 30 grams per day. Insoluble fiber may help lower your risk of heart disease and may help curb your appetite.  Soluble fiber binds cholesterol to be removed from  the blood. Foods high in soluble fiber are dried beans, citrus fruits, oats, apples, bananas, broccoli, Brussels sprouts, and eggplant.  Try to include foods fortified with plant sterols or stanols, such as yogurt, breads, juices, or margarines. Choose several fortified foods to achieve a daily intake of 2 to 3 grams of plant sterols or stanols.  Foods with omega-3 fats can help reduce your risk of heart disease. Aim to have a 3.5 oz portion of fatty fish twice per week, such as salmon, mackerel, albacore tuna, sardines, lake trout, or herring. If you wish to take a fish oil supplement, choose one that contains 1 gram of both DHA and EPA.  Limit processed meats to 2 servings (3 oz portion) weekly.  Limit the sodium in your diet to 1500 milligrams (mg) per day. If you have high blood pressure, talk to a registered dietitian about a DASH (Dietary Approaches to Stop Hypertension) eating plan.  Limit sweets and beverages with added sugar, such as soda, to no more than 5 servings per week. One serving is:   1 tablespoon sugar.  1 tablespoon jelly or jam.   cup sorbet.  1 cup lemonade.   cup regular soda. CHOOSING FOODS Starches  Allowed: Breads: All kinds (wheat, rye, raisin, white, oatmeal, [place], [place], and English muffin bread). Low-fat rolls: English muffins, frankfurter and hamburger buns, bagels, pita bread, tortillas (not fried). Pancakes, waffles, biscuits, and muffins made with recommended oil.  Avoid: Products made with saturated or trans fats, oils, or whole milk products. Butter rolls, cheese breads, croissants. Commercial doughnuts, muffins, sweet rolls, biscuits, waffles, pancakes, store-bought mixes. Crackers  Allowed: Low-fat crackers and snacks: Animal, graham, rye, saltine (with recommended oil, no lard), oyster, and matzo crackers. Bread sticks, melba toast, rusks, flatbread, pretzels, and light popcorn.  Avoid: High-fat crackers: cheese crackers, butter crackers,  and those made with coconut, palm oil, or trans fat (hydrogenated oils). Buttered popcorn. Cereals  Allowed: Hot or cold whole-grain cereals.  Avoid: Cereals containing coconut, hydrogenated vegetable fat, or animal fat. Potatoes / Pasta / Rice  Allowed: All kinds of potatoes, rice, and pasta (such as macaroni, spaghetti, and noodles).  Avoid: Pasta or rice prepared with cream sauce or high-fat cheese. Chow mein noodles, [place] fries. Vegetables  Allowed: All vegetables and vegetable juices.  Avoid: Fried vegetables. Vegetables in cream, butter, or high-fat cheese sauces. Limit coconut. Fruit in cream or custard. Protein  Allowed: Limit your intake of meat, seafood, and poultry to no more than 6 oz (cooked weight) per day. All lean, well-trimmed beef, veal, pork, and lamb. All chicken and [place] without skin. All fish and shellfish. Wild game: wild duck,  rabbit, pheasant, and venison. Egg whites or low-cholesterol egg substitutes may be used as desired. Meatless dishes: recipes with dried beans, peas, lentils, and tofu (soybean curd). Seeds and nuts: all seeds and most nuts.  Avoid: Prime grade and other heavily marbled and fatty meats, such as short ribs, spare ribs, rib eye roast or steak, frankfurters, sausage, bacon, and high-fat luncheon meats, mutton. Caviar. Commercially fried fish. Domestic duck, goose, venison sausage. Organ meats: liver, gizzard, heart, chitterlings, brains, kidney, sweetbreads. Dairy  Allowed: Low-fat cheeses: nonfat or low-fat cottage cheese (1% or 2% fat), cheeses made with part skim milk, such as mozzarella, farmers, string, or ricotta. (Cheeses should be labeled no more than 2 to 6 grams fat per oz.). Skim (or 1%) milk: liquid, powdered, or evaporated. Buttermilk made with low-fat milk. Drinks made with skim or low-fat milk or cocoa. Chocolate milk or cocoa made with skim or low-fat (1%) milk. Nonfat or low-fat yogurt.  Avoid: Whole milk cheeses, including  [place], cheddar, [place], Monterey Jack, [place], [place], [place], American, Swiss, and blue. Creamed cottage cheese, cream cheese. Whole milk and whole milk products, including buttermilk or yogurt made from whole milk, drinks made from whole milk. Condensed milk, evaporated whole milk, and 2% milk. Soups and Combination Foods  Allowed: Low-fat low-sodium soups: broth, dehydrated soups, homemade broth, soups with the fat removed, homemade cream soups made with skim or low-fat milk. Low-fat spaghetti, lasagna, chili, and Spanish rice if low-fat ingredients and low-fat cooking techniques are used.  Avoid: Cream soups made with whole milk, cream, or high-fat cheese. All other soups. Desserts and Sweets  Allowed: Sherbet, fruit ices, gelatins, meringues, and angel food cake. Homemade desserts with recommended fats, oils, and milk products. Jam, jelly, honey, marmalade, sugars, and syrups. Pure sugar candy, such as gum drops, hard candy, jelly beans, marshmallows, mints, and small amounts of dark chocolate.  Avoid: Commercially prepared cakes, pies, cookies, frosting, pudding, or mixes for these products. Desserts containing whole milk products, chocolate, coconut, lard, palm oil, or palm kernel oil. Ice cream or ice cream drinks. Candy that contains chocolate, coconut, butter, hydrogenated fat, or unknown ingredients. Buttered syrups. Fats and Oils  Allowed: Vegetable oils: safflower, sunflower, corn, soybean, cottonseed, sesame, canola, olive, or peanut. Non-hydrogenated margarines. Salad dressing or mayonnaise: homemade or commercial, made with a recommended oil. Low or nonfat salad dressing or mayonnaise.  Limit added fats and oils to 6 to 8 tsp per day (includes fats used in cooking, baking, salads, and spreads on bread). Remember to count the "hidden fats" in foods.  Avoid: Solid fats and shortenings: butter, lard, salt pork, bacon drippings. Gravy containing meat fat, shortening, or suet. Cocoa  butter, coconut. Coconut oil, palm oil, palm kernel oil, or hydrogenated oils: these ingredients are often used in bakery products, nondairy creamers, whipped toppings, candy, and commercially fried foods. Read labels carefully. Salad dressings made of unknown oils, sour cream, or cheese, such as blue cheese and Roquefort. Cream, all kinds: half-and-half, light, heavy, or whipping. Sour cream or cream cheese (even if "light" or low-fat). Nondairy cream substitutes: coffee creamers and sour cream substitutes made with palm, palm kernel, hydrogenated oils, or coconut oil. Beverages  Allowed: Coffee (regular or decaffeinated), tea. Diet carbonated beverages, mineral water. Alcohol: Check with your caregiver. Moderation is recommended.  Avoid: Whole milk, regular sodas, and juice drinks with added sugar. Condiments  Allowed: All seasonings and condiments. Cocoa powder. "Cream" sauces made with recommended ingredients.  Avoid: Carob powder made with hydrogenated fats. SAMPLE  MENU Breakfast   cup orange juice   cup oatmeal  1 slice toast  1 tsp margarine  1 cup skim milk Lunch  [place] sandwich with 2 oz [place], 2 slices bread  Lettuce and tomato slices  Fresh fruit  Carrot sticks  Coffee or tea Snack  Fresh fruit or low-fat crackers Dinner  3 oz lean ground beef  1 baked potato  1 tsp margarine   cup asparagus  Lettuce salad  1 tbs non-creamy dressing   cup peach slices  1 cup skim milk Document Released: 04/26/2008 Document Revised: 01/17/2012 Document Reviewed: 09/17/2013 [employer] Patient Information 2015 [place], [place]. This information is not intended to replace advice given to you by your health care provider. Make sure you discuss any questions you have with your health care provider.

## 2015-05-04 ENCOUNTER — Ambulatory Visit (HOSPITAL_COMMUNITY)
Admission: RE | Admit: 2015-05-04 | Discharge: 2015-05-04 | Disposition: A | Discharge status: Home / Self Care | Payer: Medicare HMO | Source: Ambulatory Visit | Attending: Cardiology | Admitting: Cardiology

## 2015-05-04 ENCOUNTER — Encounter (HOSPITAL_COMMUNITY): Payer: Self-pay | Admitting: *Deleted

## 2015-05-04 ENCOUNTER — Encounter (HOSPITAL_COMMUNITY): Payer: Self-pay | Admitting: Emergency Medicine

## 2015-05-04 ENCOUNTER — Ambulatory Visit (HOSPITAL_BASED_OUTPATIENT_CLINIC_OR_DEPARTMENT_OTHER): Payer: Medicare HMO

## 2015-05-04 ENCOUNTER — Emergency Department (HOSPITAL_COMMUNITY)
Admission: EM | Admit: 2015-05-04 | Discharge: 2015-05-05 | Disposition: A | Discharge status: Home / Self Care | Payer: Medicare HMO | Attending: Emergency Medicine | Admitting: Emergency Medicine

## 2015-05-04 ENCOUNTER — Encounter (HOSPITAL_COMMUNITY)
Admission: RE | Disposition: A | Discharge status: Home / Self Care | Payer: Self-pay | Source: Ambulatory Visit | Attending: Cardiology

## 2015-05-04 DIAGNOSIS — I97618 Postprocedural hemorrhage and hematoma of a circulatory system organ or structure following other circulatory system procedure: Secondary | ICD-10-CM | POA: Diagnosis not present

## 2015-05-04 DIAGNOSIS — I9762 Postprocedural hemorrhage of a circulatory system organ or structure following other procedure: Secondary | ICD-10-CM | POA: Insufficient documentation

## 2015-05-04 DIAGNOSIS — I340 Nonrheumatic mitral (valve) insufficiency: Secondary | ICD-10-CM | POA: Diagnosis not present

## 2015-05-04 DIAGNOSIS — M1990 Unspecified osteoarthritis, unspecified site: Secondary | ICD-10-CM | POA: Insufficient documentation

## 2015-05-04 DIAGNOSIS — I639 Cerebral infarction, unspecified: Secondary | ICD-10-CM

## 2015-05-04 DIAGNOSIS — I10 Essential (primary) hypertension: Secondary | ICD-10-CM | POA: Insufficient documentation

## 2015-05-04 DIAGNOSIS — Z87891 Personal history of nicotine dependence: Secondary | ICD-10-CM | POA: Diagnosis not present

## 2015-05-04 DIAGNOSIS — Y838 Other surgical procedures as the cause of abnormal reaction of the patient, or of later complication, without mention of misadventure at the time of the procedure: Secondary | ICD-10-CM | POA: Insufficient documentation

## 2015-05-04 DIAGNOSIS — Z85828 Personal history of other malignant neoplasm of skin: Secondary | ICD-10-CM | POA: Diagnosis not present

## 2015-05-04 DIAGNOSIS — Z79899 Other long term (current) drug therapy: Secondary | ICD-10-CM | POA: Insufficient documentation

## 2015-05-04 DIAGNOSIS — Z7982 Long term (current) use of aspirin: Secondary | ICD-10-CM | POA: Insufficient documentation

## 2015-05-04 DIAGNOSIS — K219 Gastro-esophageal reflux disease without esophagitis: Secondary | ICD-10-CM | POA: Diagnosis not present

## 2015-05-04 DIAGNOSIS — Z8673 Personal history of transient ischemic attack (TIA), and cerebral infarction without residual deficits: Secondary | ICD-10-CM | POA: Insufficient documentation

## 2015-05-04 HISTORY — PX: TEE WITHOUT CARDIOVERSION: SHX5443

## 2015-05-04 HISTORY — PX: EP IMPLANTABLE DEVICE: SHX172B

## 2015-05-04 SURGERY — ECHOCARDIOGRAM, TRANSESOPHAGEAL
Anesthesia: Moderate Sedation

## 2015-05-04 SURGERY — LOOP RECORDER INSERTION

## 2015-05-04 MED ORDER — FENTANYL CITRATE (PF) 100 MCG/2ML IJ SOLN
INTRAMUSCULAR | Status: DC | PRN
  Administered 2015-05-04: 25 ug via INTRAVENOUS

## 2015-05-04 MED ORDER — LIDOCAINE-EPINEPHRINE 1 %-1:100000 IJ SOLN
INTRAMUSCULAR | Status: DC | PRN
  Administered 2015-05-04: 20 mL via INTRADERMAL

## 2015-05-04 MED ORDER — SODIUM CHLORIDE 0.9 % IV SOLN
INTRAVENOUS | Status: DC
  Administered 2015-05-04: 10:00:00 via INTRAVENOUS

## 2015-05-04 MED ORDER — FENTANYL CITRATE (PF) 100 MCG/2ML IJ SOLN
INTRAMUSCULAR | Status: AC
  Filled 2015-05-04: qty 2

## 2015-05-04 MED ORDER — MIDAZOLAM HCL 10 MG/2ML IJ SOLN
INTRAMUSCULAR | Status: DC | PRN
Start: 2015-05-04 — End: 2015-05-04
  Administered 2015-05-04 (×2): 2 mg via INTRAVENOUS

## 2015-05-04 MED ORDER — MIDAZOLAM HCL 5 MG/ML IJ SOLN
INTRAMUSCULAR | Status: AC
  Filled 2015-05-04: qty 2

## 2015-05-04 MED ORDER — BUTAMBEN-TETRACAINE-BENZOCAINE 2-2-14 % EX AERO
INHALATION_SPRAY | CUTANEOUS | Status: DC | PRN
Start: 2015-05-04 — End: 2015-05-04
  Administered 2015-05-04: 2 via TOPICAL

## 2015-05-04 MED ORDER — LIDOCAINE-EPINEPHRINE 1 %-1:100000 IJ SOLN
INTRAMUSCULAR | Status: AC
  Filled 2015-05-04: qty 1

## 2015-05-04 MED ORDER — LIDOCAINE-EPINEPHRINE (PF) 2 %-1:200000 IJ SOLN
10.0000 mL | Freq: Once | INTRAMUSCULAR | Status: AC
  Administered 2015-05-04: 10 mL
  Filled 2015-05-04: qty 20

## 2015-05-04 SURGICAL SUPPLY — 2 items
LOOP REVEAL LINQSYS (Prosthesis & Implant Heart) ×2 IMPLANT
PACK LOOP INSERTION (CUSTOM PROCEDURE TRAY) ×2 IMPLANT

## 2015-05-04 NOTE — H&P (Signed)
Date: 04/27/2015   ID: [name], DOB 09/21/1945, MRN [id number]  PCP: Haywood Pao, MD Primary Electrophysiologist: Brexton Sofia Meredith Leeds, MD   Chief Complaint  Patient presents with  . disscus loop  . Chest Pain    History of Present Illness: [name] is a 69 y.o. male who presents today for electrophysiology evaluation. He has a history of posterior circulation CVA, HTN, HLD, and RBBB. He presented to the hospital on 9/1 with visual loss. He was diagnosed with posterior circulation stroke with MRI showing acute nonhemorrhagic infarct involving portions of the right occipital lobe and right thalamus. A TTE was done and showed no evidence of embolic source and an EF of 55%. Neurology recommended TEE and LINQ should TEE be negative for source of embolism.   Today, he denies symptoms of palpitations, chest pain, shortness of breath, orthopnea, PND, lower extremity edema, claudication, dizziness, presyncope, syncope, bleeding, or neurologic sequela. The patient is tolerating medications without difficulties and is otherwise without complaint today. He does say that he is fatigued. His vision has improved but unfortunatly is not back to nrmal. He continues to have problems with his left visual fields. Otherwise, he feels anxious which manifests as tingling hands, anxiety, and chest pain. He has been given antianxiety medications which help and just put him to sleep.   Past Medical History  Diagnosis Date  . Headache   . Arthritis     KNEES   . Skin cancer     hx of  . Cerebrovascular accident involving posterior circulation 04/02/2015  . Essential hypertension   . HLD (hyperlipidemia)   . Stroke with cerebral ischemia   . ABDOMINAL PAIN-LLQ 05/19/2008    Qualifier: Diagnosis of By: [name] MD, [place] CHEST XRAY 06/02/2008    Qualifier: Diagnosis of By: [name] MD, [name]   . GERD  05/19/2008    Qualifier: Diagnosis of By: [name] MD, [place], LEFT 05/29/2008    Qualifier: Diagnosis of By: [name] MD, [name]   . Nonspecific (abnormal) findings on radiological and other examination of body structure 06/02/2008    Qualifier: Diagnosis of By: [name] MD, [name] Problem list entry automatically replaced. Please review for accuracy.  . History of right bundle branch block (RBBB)   . Seasonal allergies    Past Surgical History  Procedure Laterality Date  . Hernia repair    . Knee surgery Right 12/2014  . Skin cancer excision  12/2014  . Cataract extraction    . Colonoscopy    . Appendectomy       Current Outpatient Prescriptions  Medication Sig Dispense Refill  . ALPRAZolam (XANAX) 0.5 MG tablet Take 1 tablet (0.5 mg total) by mouth 3 (three) times daily as needed for anxiety. 10 tablet 0  . aspirin 81 MG chewable tablet Chew 81 mg by mouth daily.    Marland Kitchen atorvastatin (LIPITOR) 80 MG tablet Take 80 mg by mouth daily.    . butalbital-acetaminophen-caffeine (FIORICET, ESGIC) 50-325-40 MG per tablet Take 1 tablet by mouth every 8 (eight) hours as needed for headache. 45 tablet 0  . clopidogrel (PLAVIX) 75 MG tablet Take 75 mg by mouth daily.    . famotidine (PEPCID) 20 MG tablet Take 1 tablet (20 mg total) by mouth 2 (two) times daily. 60 tablet 0  . fluticasone (FLONASE) 50 MCG/ACT nasal spray Place 1 spray into both nostrils daily as needed for allergies or rhinitis.    Marland Kitchen  hydrochlorothiazide (HYDRODIURIL) 25 MG tablet Take 25 mg by mouth daily.    . hydrocortisone (ANUSOL-HC) 2.5 % rectal cream Place 1 application rectally daily as needed for hemorrhoids or itching.    . irbesartan (AVAPRO) 300 MG tablet Take 300 mg by mouth daily.    . temazepam (RESTORIL) 15 MG capsule Take 30 mg by mouth at bedtime as needed for sleep.     No current  facility-administered medications for this visit.    Allergies: Hydrocodone   Social History: The patient  reports that he quit smoking about 28 years ago. He has never used smokeless tobacco. He reports that he drinks alcohol. He reports that he does not use illicit drugs.   Family History: The patient's family history includes Diabetes in his mother; Emphysema in his father; Kidney disease in his mother; Prostate cancer in his maternal uncle. There is no history of CAD.    ROS: Please see the history of present illness. Otherwise, review of systems is positive for visual disturbance, shortness of breath with activity, dizziness, headaches, chest pressure and fatigue. All other systems are reviewed and negative.    PHYSICAL EXAM: VS: BP 128/86 mmHg  Pulse 74  Ht 6\' 2"  (1.88 m)  Wt 232 lb 6.4 oz (105.416 kg)  BMI 29.83 kg/m2 , BMI Body mass index is 29.83 kg/(m^2). GEN: Well nourished, well developed, in no acute distress  HEENT: normal  Neck: no JVD, carotid bruits, or masses Cardiac: RRR; no murmurs, rubs, or gallops,no edema  Respiratory: clear to auscultation bilaterally, normal work of breathing GI: soft, nontender, nondistended, + BS MS: no deformity or atrophy  Skin: warm and dry Neuro: Strength and sensation are intact Psych: euthymic mood, full affect  EKG: EKG is ordered today. The ekg ordered today shows sinus rhythm, RBBB, 1 degree AVB, rate 74  Recent Labs: 04/06/2015: BUN 28*; Creatinine, Ser 1.49*; Hemoglobin 15.7; Platelets 201; Potassium 4.5; Sodium 140    Lipid Panel   Labs (Brief)       Component Value Date/Time   CHOL 183 04/03/2015 0421   TRIG 123 04/03/2015 0421   HDL 34* 04/03/2015 0421   CHOLHDL 5.4 04/03/2015 0421   VLDL 25 04/03/2015 0421   LDLCALC 124* 04/03/2015 0421       Wt Readings from Last 3 Encounters:  04/27/15 232 lb 6.4 oz (105.416 kg)  04/06/15 230 lb (104.327 kg)  04/02/15  232 lb (105.235 kg)      Other studies Reviewed: Additional studies/ records that were reviewed today include: TTE 04/02/15  Review of the above records today demonstrates:  - Left ventricle: The cavity size was normal. There was mild concentric hypertrophy. Systolic function was normal. The estimated ejection fraction was in the range of 50% to 55%. Wall motion was normal; there were no regional wall motion abnormalities. Doppler parameters are consistent with abnormal left ventricular relaxation (grade 1 diastolic dysfunction). - Left atrium: The atrium was mildly dilated. - Right ventricle: The cavity size was mildly dilated. Wall thickness was normal.  Impressions:  - No cardiac source of emboli was indentified.   ASSESSMENT AND PLAN:  1. Non-hemorrhagic posterior circulation CVA: Unknown cause at this time. Patient's vision has improved but is still not back to normal. He has no evidence of arrhythmia witout palpitations. He does have fatigue, but this could be related to his recent history or BP meds. Plan is for TEE evaluation for embolic source and potential LINQ placement should no source of embolism be  found.  Current medicines are reviewed at length with the patient today.  The patient does not have concerns regarding his medicines. The following changes were made today: none  Labs/ tests ordered today include: LINQ pending TEE  No orders of the defined types were placed in this encounter.    Disposition: FU with PRN pending results of LINQ  Signed, Janeth Terry Meredith Leeds, MD  04/27/2015 8:46 AM        Patient seen and examined.  Had stroke with resdual visual deficiencies.  Had TEE which showed no evidence clot or cause of his stroke.  [name] plan for LINQ placement for further monitoring of atrial fibrillation.  Risks of bleeding and infection were discussed with the patient.  Minal Stuller 05/04/2015. 1:11 PM

## 2015-05-04 NOTE — Progress Notes (Deleted)
  Echocardiogram 2D Echocardiogram has been performed.  Donata Clay 05/04/2015, 11:58 AM

## 2015-05-04 NOTE — Interval H&P Note (Signed)
History and Physical Interval Note:  05/04/2015 8:59 AM  [name]  has presented today for surgery, with the diagnosis of CRYPTOGENIC STROKE  The various methods of treatment have been discussed with the patient and family. After consideration of risks, benefits and other options for treatment, the patient has consented to  Procedure(s): TRANSESOPHAGEAL ECHOCARDIOGRAM (TEE) (N/A) as a surgical intervention .  The patient's history has been reviewed, patient examined, no change in status, stable for surgery.  I have reviewed the patient's chart and labs.  Questions were answered to the patient's satisfaction.     Dorothy Spark

## 2015-05-04 NOTE — Progress Notes (Signed)
  Echocardiogram Echocardiogram Transesophageal has been performed.  [name] 05/04/2015, 12:02 PM

## 2015-05-04 NOTE — H&P (View-Only) (Signed)
Electrophysiology Office Note   Date:  04/27/2015   ID:  [name], DOB July 10, 1946, MRN [id number]  PCP:  Haywood Pao, MD  Primary Electrophysiologist: Will Meredith Leeds, MD    Chief Complaint  Patient presents with  . disscus loop  . Chest Pain     History of Present Illness: [name] is a 69 y.o. male who presents today for electrophysiology evaluation.  He has a history of posterior circulation CVA, HTN, HLD, and RBBB.  He presented to the hospital on 9/1 with visual loss.  He was diagnosed with posterior circulation stroke with MRI showing acute nonhemorrhagic infarct involving portions of the right occipital lobe and right thalamus.  A TTE was done and showed no evidence of embolic source and an EF of 55%.  Neurology recommended TEE and LINQ should TEE be negative for source of embolism.    Today, he denies symptoms of palpitations, chest pain, shortness of breath, orthopnea, PND, lower extremity edema, claudication, dizziness, presyncope, syncope, bleeding, or neurologic sequela. The patient is tolerating medications without difficulties and is otherwise without complaint today.  He does say that he is fatigued.  His vision has improved but unfortunatly is not back to nrmal.  He continues to have problems with his left visual fields.  Otherwise, he feels anxious which manifests as tingling hands, anxiety, and chest pain.  He has been given antianxiety medications which help and just put him to sleep.   Past Medical History  Diagnosis Date  . Headache   . Arthritis     KNEES   . Skin cancer     hx of  . Cerebrovascular accident involving posterior circulation 04/02/2015  . Essential hypertension   . HLD (hyperlipidemia)   . Stroke with cerebral ischemia   . ABDOMINAL PAIN-LLQ 05/19/2008    Qualifier: Diagnosis of  By: [name] MD, [place] CHEST XRAY 06/02/2008    Qualifier: Diagnosis of  By: [name] MD, [name]   . GERD 05/19/2008    Qualifier:  Diagnosis of  By: [name] MD, [place], LEFT 05/29/2008    Qualifier: Diagnosis of  By: [name] MD, [name]   . Nonspecific (abnormal) findings on radiological and other examination of body structure 06/02/2008    Qualifier: Diagnosis of By: [name] MD, [name]  Problem list entry automatically replaced. Please review for accuracy.  . History of right bundle branch block (RBBB)   . Seasonal allergies    Past Surgical History  Procedure Laterality Date  . Hernia repair    . Knee surgery Right 12/2014  . Skin cancer excision  12/2014  . Cataract extraction    . Colonoscopy    . Appendectomy       Current Outpatient Prescriptions  Medication Sig Dispense Refill  . ALPRAZolam (XANAX) 0.5 MG tablet Take 1 tablet (0.5 mg total) by mouth 3 (three) times daily as needed for anxiety. 10 tablet 0  . aspirin 81 MG chewable tablet Chew 81 mg by mouth daily.    Marland Kitchen atorvastatin (LIPITOR) 80 MG tablet Take 80 mg by mouth daily.    . butalbital-acetaminophen-caffeine (FIORICET, ESGIC) 50-325-40 MG per tablet Take 1 tablet by mouth every 8 (eight) hours as needed for headache. 45 tablet 0  . clopidogrel (PLAVIX) 75 MG tablet Take 75 mg by mouth daily.    . famotidine (PEPCID) 20 MG tablet Take 1 tablet (20 mg total) by mouth 2 (two)  times daily. 60 tablet 0  . fluticasone (FLONASE) 50 MCG/ACT nasal spray Place 1 spray into both nostrils daily as needed for allergies or rhinitis.    . hydrochlorothiazide (HYDRODIURIL) 25 MG tablet Take 25 mg by mouth daily.    . hydrocortisone (ANUSOL-HC) 2.5 % rectal cream Place 1 application rectally daily as needed for hemorrhoids or itching.    . irbesartan (AVAPRO) 300 MG tablet Take 300 mg by mouth daily.    . temazepam (RESTORIL) 15 MG capsule Take 30 mg by mouth at bedtime as needed for sleep.     No current facility-administered medications for this visit.    Allergies:   Hydrocodone   Social History:  The patient  reports that he quit smoking  about 28 years ago. He has never used smokeless tobacco. He reports that he drinks alcohol. He reports that he does not use illicit drugs.   Family History:  The patient's family history includes Diabetes in his mother; Emphysema in his father; Kidney disease in his mother; Prostate cancer in his maternal uncle. There is no history of CAD.    ROS:  Please see the history of present illness.   Otherwise, review of systems is positive for visual disturbance, shortness of breath with activity, dizziness, headaches, chest pressure and fatigue.   All other systems are reviewed and negative.    PHYSICAL EXAM: VS:  BP 128/86 mmHg  Pulse 74  Ht 6\' 2"  (1.88 m)  Wt 232 lb 6.4 oz (105.416 kg)  BMI 29.83 kg/m2 , BMI Body mass index is 29.83 kg/(m^2). GEN: Well nourished, well developed, in no acute distress HEENT: normal Neck: no JVD, carotid bruits, or masses Cardiac: RRR; no murmurs, rubs, or gallops,no edema  Respiratory:  clear to auscultation bilaterally, normal work of breathing GI: soft, nontender, nondistended, + BS MS: no deformity or atrophy Skin: warm and dry Neuro:  Strength and sensation are intact Psych: euthymic mood, full affect  EKG:  EKG is ordered today. The ekg ordered today shows sinus rhythm, RBBB, 1 degree AVB, rate 74  Recent Labs: 04/06/2015: BUN 28*; Creatinine, Ser 1.49*; Hemoglobin 15.7; Platelets 201; Potassium 4.5; Sodium 140    Lipid Panel     Component Value Date/Time   CHOL 183 04/03/2015 0421   TRIG 123 04/03/2015 0421   HDL 34* 04/03/2015 0421   CHOLHDL 5.4 04/03/2015 0421   VLDL 25 04/03/2015 0421   LDLCALC 124* 04/03/2015 0421     Wt Readings from Last 3 Encounters:  04/27/15 232 lb 6.4 oz (105.416 kg)  04/06/15 230 lb (104.327 kg)  04/02/15 232 lb (105.235 kg)      Other studies Reviewed: Additional studies/ records that were reviewed today include: TTE 04/02/15  Review of the above records today demonstrates:  - Left ventricle: The cavity  size was normal. There was mild concentric hypertrophy. Systolic function was normal. The estimated ejection fraction was in the range of 50% to 55%. Wall motion was normal; there were no regional wall motion abnormalities. Doppler parameters are consistent with abnormal left ventricular relaxation (grade 1 diastolic dysfunction). - Left atrium: The atrium was mildly dilated. - Right ventricle: The cavity size was mildly dilated. Wall thickness was normal.  Impressions:  - No cardiac source of emboli was indentified.   ASSESSMENT AND PLAN:  1.  Non-hemorrhagic posterior circulation CVA: Unknown cause at this time.  Patient's vision has improved but is still not back to normal.  He has no evidence of arrhythmia witout  palpitations.  He does have fatigue, but this could be related to his recent history or BP meds.   Plan is for TEE evaluation for embolic source and potential LINQ placement should no source of embolism be found.  Current medicines are reviewed at length with the patient today.   The patient does not have concerns regarding his medicines.  The following changes were made today:  none  Labs/ tests ordered today include: LINQ pending TEE  No orders of the defined types were placed in this encounter.     Disposition:   FU with PRN pending results of LINQ  Signed, Will Meredith Leeds, MD  04/27/2015 8:46 AM     [facility] [address] [place] [address] [phone] (office) [phone] (fax)

## 2015-05-04 NOTE — CV Procedure (Signed)
     Transesophageal Echocardiogram Note  ERVEY FALLIN [id number] Sep 27, 1945  Procedure: Transesophageal Echocardiogram Indications: CVA  Procedure Details Consent: Obtained Time Out: Verified patient identification, verified procedure, site/side was marked, verified correct patient position, special equipment/implants available, Radiology Safety Procedures followed,  medications/allergies/relevent history reviewed, required imaging and test results available.  Performed  Medications: Fentanyl: 25 mcg  Versed: 4 mg  No intracardiac source of emboli was identified. No PFO. Negative bubble study.  Complications: No apparent complications Patient did tolerate procedure well.  Dorothy Spark, MD, [facility] 05/04/2015, 8:59 AM

## 2015-05-04 NOTE — ED Notes (Signed)
Pt. reports bleeding at surgical site at home this evening , pt. had a loop recorder inserted by Dr. Curt Bears today , dressing changed  at triage , no bleeding at arrival . Denies any pain or discomfort .

## 2015-05-04 NOTE — Discharge Instructions (Signed)
Transesophageal Echocardiogram °Transesophageal echocardiography (TEE) is a special type of test that produces images of the heart by using sound waves (echocardiogram). This type of echocardiography can obtain better images of the heart than standard echocardiography. TEE is done by passing a flexible tube down the esophagus. The heart is located in front of the esophagus. Because the heart and esophagus are close to one another, your health care provider can take very clear, detailed pictures of the heart via ultrasound waves. °TEE may be done: °· If your health care provider needs more information based on standard echocardiography findings. °· If you had a stroke. This might have happened because a clot formed in your heart. TEE can visualize different areas of the heart and check for clots. °· To check valve anatomy and function. °· To check for infection on the inside of your heart (endocarditis). °· To evaluate the dividing wall (septum) of the heart and presence of a hole that did not close after birth (patent foramen ovale or atrial septal defect). °· To help diagnose a tear in the wall of the aorta (aortic dissection). °· During cardiac valve surgery. This allows the surgeon to assess the valve repair before closing the chest. °· During a variety of other cardiac procedures to guide positioning of catheters. °· Sometimes before a cardioversion, which is a shock to convert heart rhythm back to normal. °LET YOUR HEALTH CARE PROVIDER KNOW ABOUT:  °· Any allergies you have. °· All medicines you are taking, including vitamins, herbs, eye drops, creams, and over-the-counter medicines. °· Previous problems you or members of your family have had with the use of anesthetics. °· Any blood disorders you have. °· Previous surgeries you have had. °· Medical conditions you have. °· Swallowing difficulties. °· An esophageal obstruction. °RISKS AND COMPLICATIONS  °Generally, TEE is a safe procedure. However, as with any  procedure, complications can occur. Possible complications include an esophageal tear (rupture). °BEFORE THE PROCEDURE  °· Do not eat or drink for 6 hours before the procedure or as directed by your health care provider. °· Arrange for someone to drive you home after the procedure. Do not drive yourself home. During the procedure, you will be given medicines that can continue to make you feel drowsy and can impair your reflexes. °· An IV access tube will be started in the arm. °PROCEDURE  °· A medicine to help you relax (sedative) will be given through the IV access tube. °· A medicine may be sprayed or gargled to numb the back of the throat. °· Your blood pressure, heart rate, and breathing (vital signs) will be monitored during the procedure. °· The TEE probe is a long, flexible tube. The tip of the probe is placed into the back of the mouth, and you will be asked to swallow. This helps to pass the tip of the probe into the esophagus. Once the tip of the probe is in the correct area, your health care provider can take pictures of the heart. °· TEE is usually not a painful procedure. You may feel the probe press against the back of the throat. The probe does not enter the trachea and does not affect your breathing. °AFTER THE PROCEDURE  °· You will be in bed, resting, until you have fully returned to consciousness. °· When you first awaken, your throat may feel slightly sore and will probably still feel numb. This will improve slowly over time. °· You will not be allowed to eat or drink until it   is clear that the numbness has improved. °· Once you have been able to drink, urinate, and sit on the edge of the bed without feeling sick to your stomach (nausea) or dizzy, you may be cleared to go home. °· You should have a friend or family member with you for the next 24 hours after your procedure. °Document Released: 10/08/2002 Document Revised: 07/23/2013 Document Reviewed: 01/17/2013 °[employer]® Patient Information  ©2015 [employer]. This information is not intended to replace advice given to you by your health care provider. Make sure you discuss any questions you have with your health care provider. ° °

## 2015-05-05 ENCOUNTER — Encounter (HOSPITAL_COMMUNITY): Payer: Self-pay | Admitting: Cardiology

## 2015-05-05 NOTE — Discharge Instructions (Signed)
You have [address] sut[address]. There is Surgifoam on top of this. Follow-up with your cardiologist for removal of the dressing and sutures.

## 2015-05-05 NOTE — ED Provider Notes (Signed)
CSN: [id number]     Arrival date & time 05/04/15  1914 History   First MD Initiated Contact with Patient 05/04/15 2219     Chief Complaint  Patient presents with  . Post-op Problem     (Consider location/radiation/quality/duration/timing/severity/associated sxs/prior Treatment) The history is provided by the patient.   patient had a loop recorder placed today for rhythm evaluation after previous stroke. He has had some bleeding at the site since. Unable to be controlled with pressure. He continues to lose. He is on Plavix. No lightheadedness or dizziness.  Past Medical History  Diagnosis Date  . Headache   . Arthritis     KNEES   . Skin cancer     hx of  . Cerebrovascular accident involving posterior circulation ([place]) 04/02/2015  . Essential hypertension   . HLD (hyperlipidemia)   . Stroke with cerebral ischemia ([place])   . ABDOMINAL PAIN-LLQ 05/19/2008    Qualifier: Diagnosis of  By: [name] MD, [place] CHEST XRAY 06/02/2008    Qualifier: Diagnosis of  By: [name] MD, [name]   . GERD 05/19/2008    Qualifier: Diagnosis of  By: [name] MD, [place], LEFT 05/29/2008    Qualifier: Diagnosis of  By: [name] MD, [name]   . Nonspecific (abnormal) findings on radiological and other examination of body structure 06/02/2008    Qualifier: Diagnosis of By: [name] MD, [name]  Problem list entry automatically replaced. Please review for accuracy.  . History of right bundle branch block (RBBB)   . Seasonal allergies    Past Surgical History  Procedure Laterality Date  . Hernia repair    . Knee surgery Right 12/2014  . Skin cancer excision  12/2014  . Cataract extraction    . Colonoscopy    . Appendectomy    . Ep implantable device N/A 05/04/2015    Procedure: Loop Recorder Insertion;  Surgeon: Will Meredith Leeds, MD;  Location: [facility];  Service: Cardiovascular;  Laterality: N/A;   Family History  Problem Relation Age of Onset  . Prostate cancer Maternal  Uncle   . CAD Neg Hx     NEGATIVE HX OF PREMATURE CAD  . Diabetes Mother   . Kidney disease Mother   . Emphysema Father    Social History  Substance Use Topics  . Smoking status: Former Smoker    Quit date: 08/01/1986  . Smokeless tobacco: Never Used  . Alcohol Use: Yes     Comment: occasional    Review of Systems  Respiratory: Negative for shortness of breath.   Cardiovascular: Negative for chest pain.  Hematological: Bruises/bleeds easily.      Allergies  Hydrocodone  Home Medications   Prior to Admission medications   Medication Sig Start Date End Date Taking? Authorizing Provider  ALPRAZolam Duanne Moron) 0.5 MG tablet Take 1 tablet (0.5 mg total) by mouth 3 (three) times daily as needed for anxiety. 04/06/15  Yes Robyn M Hess, PA-C  aspirin 81 MG chewable tablet Chew 81 mg by mouth daily.   Yes Historical Provider, MD  atorvastatin (LIPITOR) 80 MG tablet Take 80 mg by mouth daily.   Yes Historical Provider, MD  butalbital-acetaminophen-caffeine (FIORICET, ESGIC) 50-325-40 MG per tablet Take 1 tablet by mouth every 8 (eight) hours as needed for headache. 04/04/15  Yes Verlee Monte, MD  clopidogrel (PLAVIX) 75 MG tablet Take 75 mg by mouth daily.   Yes Historical Provider, MD  famotidine (  PEPCID) 20 MG tablet Take 1 tablet (20 mg total) by mouth 2 (two) times daily. 04/06/15  Yes Robyn M Hess, PA-C  hydrochlorothiazide (HYDRODIURIL) 25 MG tablet Take 25 mg by mouth daily.   Yes Historical Provider, MD  hydrocortisone (ANUSOL-HC) 2.5 % rectal cream Place 1 application rectally daily as needed for hemorrhoids or itching.   Yes Historical Provider, MD  irbesartan (AVAPRO) 300 MG tablet Take 300 mg by mouth daily.   Yes Historical Provider, MD   BP 141/81 mmHg  Pulse 59  Temp(Src) 97.8 F (36.6 C) (Oral)  Resp 16  Ht 6' (1.829 m)  Wt 229 lb (103.874 kg)  BMI 31.05 kg/m2  SpO2 99% Physical Exam  Constitutional: He appears well-developed.  Pulmonary/Chest:  Approximately 1 cm  meter incision to left anterior chest. Loop recorder palpated below this. There is some bruising below this left chest. Wound is still oozing. Dressing removed and Steri-Strips placed after sterilization. Continued to lose. Sutures then placed. Continued oozing and Gelfoam was then placed. Bleeding stopped.    ED Course  Procedures (including critical care time) Labs Review Labs Reviewed - No data to display  Imaging Review No results found. I have personally reviewed and evaluated these images and lab results as part of my medical decision-making.   EKG Interpretation None      MDM   Final diagnoses:  Postoperative hemorrhage involving circulatory system following non-circulatory system procedure    Patient bleeding post Loop recorder. Bleeding hopefully controlled now with Gelfoam. 2 5-0 glycol repeated sutures placed.  LACERATION REPAIR Performed by: Mackie Pai Authorized by: Mackie Pai Consent: Verbal consent obtained. Risks and benefits: risks, benefits and alternatives were discussed Consent given by: patient Patient identity confirmed: provided demographic data Prepped and Draped in normal sterile fashion Wound explored  Laceration Location: left chest  Laceration Length: 1cm  Anesthesia: local infiltration  Local anesthetic: lidocaine2% with epinephrine  Anesthetic total: 1 ml  Irrigation method: syringe Amount of cleaning: standard  Skin closure: 5-0 vicryl rapide  Number of sutures: 2  Technique: simple interupted  Patient tolerance: Patient tolerated the procedure well with no immediate complications.    Davonna Belling, MD 05/05/15 [phone]

## 2015-05-06 ENCOUNTER — Encounter: Payer: Self-pay | Admitting: Neurology

## 2015-05-06 ENCOUNTER — Ambulatory Visit (INDEPENDENT_AMBULATORY_CARE_PROVIDER_SITE_OTHER): Payer: Medicare HMO | Admitting: Neurology

## 2015-05-06 VITALS — BP 143/87 | HR 67 | Ht 74.0 in | Wt 220.0 lb

## 2015-05-06 DIAGNOSIS — I10 Essential (primary) hypertension: Secondary | ICD-10-CM | POA: Diagnosis not present

## 2015-05-06 DIAGNOSIS — I63431 Cerebral infarction due to embolism of right posterior cerebral artery: Secondary | ICD-10-CM

## 2015-05-06 DIAGNOSIS — F411 Generalized anxiety disorder: Secondary | ICD-10-CM | POA: Diagnosis not present

## 2015-05-06 MED ORDER — ALPRAZOLAM 0.5 MG PO TABS: 0.5000 mg | ORAL_TABLET | Freq: Three times a day (TID) | ORAL | Status: DC | PRN

## 2015-05-06 NOTE — Patient Instructions (Addendum)
-   continue ASA and plavix for total 3 months and then plavix alone.  - continue lipitor for stroke prevention - follow up with cardiology for loop recording - Follow up with your primary care physician for stroke risk factor modification. Recommend maintain blood pressure goal <130/80, diabetes with hemoglobin A1c goal below 6.5% and lipids with LDL cholesterol goal below 70 mg/dL.  - check BP at home. - follow up in 3 months.

## 2015-05-06 NOTE — Progress Notes (Signed)
STROKE NEUROLOGY FOLLOW UP NOTE  NAME: [name] DOB: 02-18-1946  REASON FOR VISIT: stroke follow up HISTORY FROM: Patient and chart  Today we had the pleasure of seeing [name] in follow-up at our Neurology Clinic. Pt was accompanied by wife.   History Summary Mr. [name] is a 69 y.o. male with history of hypertension was admitted on 04/02/2015 for transient visual changes and speech difficulties. Symptoms resolved. However, initial symptoms resemble migraine aura. MRI showed right occipital and temporal as well as right thalamic infarct, involving right PCA and MCA, possibly embolic pattern. MRI showed right PCA occlusion and left P2 high-grade stenosis. CT of the neck no significance extracranial ICA stenosis. 2-D Echo unremarkable, LDL 124 A1c 5.7. Due to intracranial stenosis, he was started on dual antiplatelet and continued Lipitor 80 on discharge. He was recommended to have outpatient TEE and loop recorder placement.  Interval History During the interval time, the patient has been doing well. No recurrent stroke symptoms. He had TEE done which did not show cardiac source of emboli, no PFO. Subsequently, he had loop recorder placed although suffered with postop bleeding. Blood pressure 143/82 in clinic today is on Avapro and HCTZ for blood pressure control. Still has intermittent anxiety, taking when necessary Xanax. No other complains.    REVIEW OF SYSTEMS: Full 14 system review of systems performed and notable only for those listed below and in HPI above, all others are negative:  Constitutional:  Fatigue Cardiovascular:  Ear/Nose/Throat:   Skin:  Eyes:  Blurry vision Respiratory:   Gastroitestinal:   Genitourinary:  Hematology/Lymphatic:   Endocrine:  Musculoskeletal:  Aching muscles Allergy/Immunology:   Neurological:  Headache, numbness, dizziness Psychiatric:  Sleep:   The following represents the patient's updated allergies and side effects  list: Allergies  Allergen Reactions  . Hydrocodone Itching    The neurologically relevant items on the patient's problem list were reviewed on today's visit.  Neurologic Examination  A problem focused neurological exam (12 or more points of the single system neurologic examination, vital signs counts as 1 point, cranial nerves count for 8 points) was performed.  Blood pressure 143/87, pulse 67, height 6\' 2"  (1.88 m), weight 220 lb (99.791 kg).  General - Well nourished, well developed, in no apparent distress.  Ophthalmologic - Sharp disc margins OU.   Cardiovascular - Regular rate and rhythm with no murmur.  Mental Status -  Level of arousal and orientation to time, place, and person were intact. Language including expression, naming, repetition, comprehension was assessed and found intact. Fund of Knowledge was assessed and was intact.  Cranial Nerves II - XII - II - Visual field intact OU. III, IV, VI - Extraocular movements intact. V - Facial sensation intact bilaterally. VII - Facial movement intact bilaterally. VIII - Hearing & vestibular intact bilaterally. X - Palate elevates symmetrically. XI - Chin turning & shoulder shrug intact bilaterally. XII - Tongue protrusion intact.  Motor Strength - The patient's strength was normal in all extremities and pronator drift was absent.  Bulk was normal and fasciculations were absent.   Motor Tone - Muscle tone was assessed at the neck and appendages and was normal.  Reflexes - The patient's reflexes were 1+ in all extremities and he had no pathological reflexes.  Sensory - Light touch, temperature/pinprick were assessed and were normal.    Coordination - The patient had normal movements in the hands and feet with no ataxia or dysmetria.  Tremor was absent.  Gait and Station - The patient's transfers, posture, gait, station, and turns were observed as normal.  Data reviewed: I personally reviewed the images and agree with  the radiology interpretations.  Ct Head Wo Contrast 04/02/2015  Retention cyst in posterior left maxillary antrum. No intracranial mass, hemorrhage, or focal gray - white compartment lesions/acute appearing infarct.   [name] Wo Contrast 04/02/2015  MRI HEAD  Acute nonhemorrhagic infarct involving portions of the right occipital lobe and right thalamus. Complex polypoid opacification inferior aspect left maxillary sinus.  MRA HEAD  Intracranial atherosclerotic type changes most notable posterior circulation including: High-grade long segment stenosis majority of the right posterior cerebral artery. High grade focal stenosis P2 segment left posterior cerebral artery   CUS - Bilateral: 1-39% ICA stenosis. Vertebral artery flow is antegrade.  2D echo - - Left ventricle: The cavity size was normal. There was mild concentric hypertrophy. Systolic function was normal. The estimated ejection fraction was in the range of 50% to 55%. Wall motion was normal; there were no regional wall motion abnormalities. Doppler parameters are consistent with abnormal left ventricular relaxation (grade 1 diastolic dysfunction). - Left atrium: The atrium was mildly dilated. - Right ventricle: The cavity size was mildly dilated. Wall thickness was normal. Impressions: - No cardiac source of emboli was indentified.  TEE - No intracardiac source of emboli was identified. No PFO. Negative bubble study.  Component     Latest Ref Rng 04/03/2015  Cholesterol     0 - 200 mg/dL 183  Triglycerides     <150 mg/dL 123  HDL Cholesterol     >40 mg/dL 34 (L)  Total CHOL/HDL Ratio      5.4  VLDL     0 - 40 mg/dL 25  LDL (calc)     0 - 99 mg/dL 124 (H)  Hemoglobin A1C     4.8 - 5.6 % 5.7 (H)  Mean Plasma Glucose      117  Vitamin B-12     180 - 914 pg/mL 449    Assessment: As you may recall, he is a 69 y.o. Caucasian male with PMH of HTN was admitted on 04/02/2015 for transient  visual changes and speech difficulties. Symptoms resolved. However, initial symptoms resemble migraine aura. MRI showed right occipital and temporal as well as right thalamic infarct, involving right PCA and MCA, possibly embolic pattern. MRI showed right PCA occlusion and left P2 high-grade stenosis. CT of the neck no significance extracranial ICA stenosis. 2-D Echo unremarkable, LDL 124 A1c 5.7. Due to intracranial stenosis, he was started on dual antiplatelet and continued Lipitor 80 on discharge. Had outpatient TEE done negative and loop recorder placement. It is still has intermittent anxiety.  Plan:  - continue ASA and plavix for total 3 months and then plavix alone.  - continue lipitor for stroke prevention - follow up with cardiology for loop recording - Follow up with your primary care physician for stroke risk factor modification. Recommend maintain blood pressure goal <130/80, diabetes with hemoglobin A1c goal below 6.5% and lipids with LDL cholesterol goal below 70 mg/dL.  - check BP at home. - RTC in 3 months.  I spent more than 25 minutes of face to face time with the patient. Greater than 50% of time was spent in counseling and coordination of care.   No orders of the defined types were placed in this encounter.    Meds ordered this encounter  Medications  . ALPRAZolam (XANAX) 0.5 MG tablet  Sig: Take 1 tablet (0.5 mg total) by mouth 3 (three) times daily as needed for anxiety.    Dispense:  30 tablet    Refill:  0    Patient Instructions  - continue ASA and plavix for total 3 months and then plavix alone.  - continue lipitor for stroke prevention - follow up with cardiology for loop recording - Follow up with your primary care physician for stroke risk factor modification. Recommend maintain blood pressure goal <130/80, diabetes with hemoglobin A1c goal below 6.5% and lipids with LDL cholesterol goal below 70 mg/dL.  - check BP at home. - follow up in 3  months.   Rosalin Hawking, MD PhD [facility] [address] [place], [place] [address] [phone]

## 2015-05-11 ENCOUNTER — Ambulatory Visit (INDEPENDENT_AMBULATORY_CARE_PROVIDER_SITE_OTHER): Payer: Medicare HMO | Admitting: *Deleted

## 2015-05-11 DIAGNOSIS — I639 Cerebral infarction, unspecified: Secondary | ICD-10-CM

## 2015-05-11 LAB — CUP PACEART INCLINIC DEVICE CHECK
Date Time Interrogation Session: 20161010114409
MDC IDC SET ZONE DETECTION INTERVAL: 3000 ms
Zone Setting Detection Interval: 2000 ms
Zone Setting Detection Interval: 370 ms

## 2015-05-11 NOTE — Progress Notes (Signed)
Loop wound check in clinic. Battery- good. R waves 0.61mV. Wound edges approximated without redness, swelling, drainage. Ecchymosis noted, but dissapating. Sutures removed (placed in the ER on the evening of implantation due to bleeding). Pt with 2 pause episodes- inappropriate detection, pt reports these occurred at time of implant. Enrolled in remote monitoring. Monthly Carelink summary reports, ROV with WC PRN.

## 2015-05-15 ENCOUNTER — Telehealth: Payer: Self-pay | Admitting: Neurology

## 2015-05-15 NOTE — Telephone Encounter (Signed)
This morning patient had a minute of isolated right eye peripheral vision changes. No headache. No focal weakness. No speech slurring, no facial droop, no sensory changes, no altered mentation, no dizziness or other focal neurologic deficits.  He sat down for a little bit and felt better. His symptoms resolved completely. Please give patient a call. I told them Dr. Erlinda Hong would call them back but if vision changes occur again proceed to the ED immediately.

## 2015-05-19 NOTE — Telephone Encounter (Signed)
Discussed with patient over the phone, asking the symptoms last Friday. He stated that he was in [employer] out while he had 2-3 seconds of right-sided visual field blurriness and blackout. It quickly resolved, he then felt mild dizziness. He went home and slept for 2 hours. Since then, he is back to baseline without any problem.  On review of his CTA neck in September, it shows bilateral PCA high-grade stenosis or occlusion. I suspect he may have a low blood pressure causing hypoperfusion. He stated that he might have dehydration that day. He usually checks his blood pressure in the morning and recent check SBP 120s. He is taking HCTZ 12.5 mg and Avapro for blood pressure control. If he takes HCTZ 25 mg, his blood pressure will be too low. He takes BP meds in the morning.  I asked him to check his blood pressure twice a day, in the morning and in the afternoon, to see if there is any low blood pressure during the day. Due to intracranial stenosis, his blood pressure goal should be 120-150. I encouraged him to take adequate fluid during the day. He expressed understanding and appreciation. He has appointment in January.  Rosalin Hawking, MD PhD Stroke Neurology 05/19/2015 6:52 PM

## 2015-06-04 ENCOUNTER — Ambulatory Visit (INDEPENDENT_AMBULATORY_CARE_PROVIDER_SITE_OTHER): Payer: Medicare HMO | Admitting: *Deleted

## 2015-06-04 DIAGNOSIS — I639 Cerebral infarction, unspecified: Secondary | ICD-10-CM

## 2015-06-05 NOTE — Progress Notes (Signed)
Carelink Summary Report/loop recorder 

## 2015-07-05 LAB — CUP PACEART REMOTE DEVICE CHECK: Date Time Interrogation Session: 20161103063540

## 2015-07-05 NOTE — Progress Notes (Signed)
Carelink summary report received. Battery status OK. Normal device function. No new symptom, tachy, brady, or AF episodes. 2 pause episodes, inappropriate, from implant date/time. Monthly summary reports and ROV with JA PRN.

## 2015-07-06 ENCOUNTER — Ambulatory Visit (INDEPENDENT_AMBULATORY_CARE_PROVIDER_SITE_OTHER): Payer: Medicare HMO | Admitting: *Deleted

## 2015-07-06 DIAGNOSIS — I639 Cerebral infarction, unspecified: Secondary | ICD-10-CM | POA: Diagnosis not present

## 2015-07-06 NOTE — Progress Notes (Signed)
Carelink Summary Report / Loop Recorder 

## 2015-07-08 ENCOUNTER — Encounter: Payer: Self-pay | Admitting: Cardiology

## 2015-08-04 ENCOUNTER — Ambulatory Visit (INDEPENDENT_AMBULATORY_CARE_PROVIDER_SITE_OTHER): Payer: Medicare HMO | Admitting: *Deleted

## 2015-08-04 DIAGNOSIS — I639 Cerebral infarction, unspecified: Secondary | ICD-10-CM

## 2015-08-05 DIAGNOSIS — D485 Neoplasm of uncertain behavior of skin: Secondary | ICD-10-CM | POA: Diagnosis not present

## 2015-08-05 DIAGNOSIS — Z85828 Personal history of other malignant neoplasm of skin: Secondary | ICD-10-CM | POA: Diagnosis not present

## 2015-08-05 DIAGNOSIS — L814 Other melanin hyperpigmentation: Secondary | ICD-10-CM | POA: Diagnosis not present

## 2015-08-05 DIAGNOSIS — L570 Actinic keratosis: Secondary | ICD-10-CM | POA: Diagnosis not present

## 2015-08-06 NOTE — Progress Notes (Signed)
Carelink Summary Report / Loop Recorder 

## 2015-08-12 ENCOUNTER — Encounter: Payer: Self-pay | Admitting: Neurology

## 2015-08-12 ENCOUNTER — Ambulatory Visit (INDEPENDENT_AMBULATORY_CARE_PROVIDER_SITE_OTHER): Payer: Medicare HMO | Admitting: Neurology

## 2015-08-12 VITALS — BP 130/90 | HR 85 | Ht 74.0 in | Wt 234.4 lb

## 2015-08-12 DIAGNOSIS — E785 Hyperlipidemia, unspecified: Secondary | ICD-10-CM

## 2015-08-12 DIAGNOSIS — I639 Cerebral infarction, unspecified: Secondary | ICD-10-CM | POA: Diagnosis not present

## 2015-08-12 DIAGNOSIS — I10 Essential (primary) hypertension: Secondary | ICD-10-CM

## 2015-08-12 DIAGNOSIS — I63431 Cerebral infarction due to embolism of right posterior cerebral artery: Secondary | ICD-10-CM | POA: Diagnosis not present

## 2015-08-12 DIAGNOSIS — I679 Cerebrovascular disease, unspecified: Secondary | ICD-10-CM | POA: Diagnosis not present

## 2015-08-12 NOTE — Patient Instructions (Addendum)
-   continue plavix and lipitor for stroke prevention - follow up with loop recording - Follow up with your primary care physician for stroke risk factor modification. Recommend maintain blood pressure goal <130/80, diabetes with hemoglobin A1c goal below 6.5% and lipids with LDL cholesterol goal below 70 mg/dL.  - check BP at home, avoid low BP - we recommended to drive during the day not at night, no long distance and drive in familiar roads. - follow up in 6 months.

## 2015-08-12 NOTE — Progress Notes (Signed)
STROKE NEUROLOGY FOLLOW UP NOTE  NAME: [name] DOB: 08/11/1945  REASON FOR VISIT: stroke follow up HISTORY FROM: Patient and chart  Today we had the pleasure of seeing [name] in follow-up at our Neurology Clinic. Pt was accompanied by wife.   History Summary [name] is a 70 y.o. male with history of hypertension was admitted on 04/02/2015 for transient visual changes and speech difficulties. Symptoms resolved. However, initial symptoms resemble migraine aura. MRI showed right occipital and temporal as well as right thalamic infarct, involving right PCA and MCA, possibly embolic pattern. MRI showed right PCA occlusion and left P2 high-grade stenosis. CT of the neck no significance extracranial ICA stenosis. 2-D Echo unremarkable, LDL 124 A1c 5.7. Due to intracranial stenosis, he was started on dual antiplatelet and continued Lipitor 80 on discharge. He was recommended to have outpatient TEE and loop recorder placement.  05/06/15 follow up - the patient has been doing well. No recurrent stroke symptoms. He had TEE done which did not show cardiac source of emboli, no PFO. Subsequently, he had loop recorder placed although suffered with postop bleeding. Blood pressure 143/82 in clinic today is on Avapro and HCTZ for blood pressure control. Still has intermittent anxiety, taking when necessary Xanax. No other complains.    Interval History During the interval time, pt has been doing OK. On 05/15/15 he was in [employer] out while he had 2-3 seconds of right-sided visual field blurriness and blackout. It quickly resolved, he then felt mild dizziness. He went home and slept for 2 hours and back to baseline. Due to bilateral PCA high grade stenosis / occlusion, his episode was considered to be likely related to low BP. He was told to be hydrated and monitor BP at home with goal BP 120-150. He was on HCTZ 25 and avapro decreased from 300 to 150mg  at that time.   Stated  that it happened again 2 weeks ago with quickly bending over to reach inside of oven, felt dizziness, backed up, stood up, some wooziness, lasting < 30 sec. BP 117/72 at that time. He did noted similar episodes with quick bending or standing up in the past. Today BP 130/90.  REVIEW OF SYSTEMS: Full 14 system review of systems performed and notable only for those listed below and in HPI above, all others are negative:  Constitutional:  Fatigue, appetite change Cardiovascular:  Ear/Nose/Throat:   Ringing in ears, runny nose Skin:  Eyes:  Blurry vision Respiratory:   Gastroitestinal:   Genitourinary:  Hematology/Lymphatic:   Endocrine:  Musculoskeletal:  Allergy/Immunology:   Neurological:   Psychiatric:  Sleep:   The following represents the patient's updated allergies and side effects list: Allergies  Allergen Reactions  . Hydrocodone Itching    The neurologically relevant items on the patient's problem list were reviewed on today's visit.  Neurologic Examination  A problem focused neurological exam (12 or more points of the single system neurologic examination, vital signs counts as 1 point, cranial nerves count for 8 points) was performed.  Blood pressure 130/90, pulse 85, height 6\' 2"  (1.88 m), weight 234 lb 6.4 oz (106.323 kg).  General - Well nourished, well developed, in no apparent distress.  Ophthalmologic - Sharp disc margins OU.   Cardiovascular - Regular rate and rhythm with no murmur.  Mental Status -  Level of arousal and orientation to time, place, and person were intact. Language including expression, naming, repetition, comprehension was assessed and found intact. Fund  of Knowledge was assessed and was intact.  Cranial Nerves II - XII - II - Visual field intact OU. III, IV, VI - Extraocular movements intact. V - Facial sensation intact bilaterally. VII - Facial movement intact bilaterally. VIII - Hearing & vestibular intact bilaterally. X - Palate  elevates symmetrically. XI - Chin turning & shoulder shrug intact bilaterally. XII - Tongue protrusion intact.  Motor Strength - The patient's strength was normal in all extremities and pronator drift was absent.  Bulk was normal and fasciculations were absent.   Motor Tone - Muscle tone was assessed at the neck and appendages and was normal.  Reflexes - The patient's reflexes were 1+ in all extremities and he had no pathological reflexes.  Sensory - Light touch, temperature/pinprick were assessed and were normal.    Coordination - The patient had normal movements in the hands and feet with no ataxia or dysmetria.  Tremor was absent.  Gait and Station - The patient's transfers, posture, gait, station, and turns were observed as normal.  Data reviewed: I personally reviewed the images and agree with the radiology interpretations.  Ct Head Wo Contrast 04/02/2015  Retention cyst in posterior left maxillary antrum. No intracranial mass, hemorrhage, or focal gray - white compartment lesions/acute appearing infarct.   [name] Wo Contrast 04/02/2015  MRI HEAD  Acute nonhemorrhagic infarct involving portions of the right occipital lobe and right thalamus. Complex polypoid opacification inferior aspect left maxillary sinus.  MRA HEAD  Intracranial atherosclerotic type changes most notable posterior circulation including: High-grade long segment stenosis majority of the right posterior cerebral artery. High grade focal stenosis P2 segment left posterior cerebral artery   CUS - Bilateral: 1-39% ICA stenosis. Vertebral artery flow is antegrade.  2D echo - - Left ventricle: The cavity size was normal. There was mild concentric hypertrophy. Systolic function was normal. The estimated ejection fraction was in the range of 50% to 55%. Wall motion was normal; there were no regional wall motion abnormalities. Doppler parameters are consistent with abnormal left ventricular  relaxation (grade 1 diastolic dysfunction). - Left atrium: The atrium was mildly dilated. - Right ventricle: The cavity size was mildly dilated. Wall thickness was normal. Impressions: - No cardiac source of emboli was indentified.  TEE - No intracardiac source of emboli was identified. No PFO. Negative bubble study.  Component     Latest Ref Rng 04/03/2015  Cholesterol     0 - 200 mg/dL 183  Triglycerides     <150 mg/dL 123  HDL Cholesterol     >40 mg/dL 34 (L)  Total CHOL/HDL Ratio      5.4  VLDL     0 - 40 mg/dL 25  LDL (calc)     0 - 99 mg/dL 124 (H)  Hemoglobin A1C     4.8 - 5.6 % 5.7 (H)  Mean Plasma Glucose      117  Vitamin B-12     180 - 914 pg/mL 449    Assessment: As you may recall, he is a 70 y.o. Caucasian male with PMH of HTN was admitted on 04/02/2015 for transient visual changes and speech difficulties. Symptoms resolved. However, initial symptoms resemble migraine aura. MRI showed right occipital and temporal as well as right thalamic infarct, involving right PCA and MCA, possibly embolic pattern. MRA showed right PCA occlusion and left P2 high-grade stenosis. CT of the neck no significance extracranial ICA stenosis. 2-D Echo unremarkable, LDL 124 A1c 5.7. Due to intracranial stenosis, he  was started on dual antiplatelet and continued Lipitor 80 on discharge. Had outpatient TEE done negative and loop recorder placement. So far no afib recorded. Finished course of dual antiplatelet, on plavix now.   Had a couple of episode of transient lightheadedness and blurry vision or lose of vision, at least one time with position changes, likely due to hypotension in the setting of b/l PCA high grade stenosis vs. Occlusion. Recommend BP monitoring, hydration.   Plan:  - continue plavix and lipitor for stroke prevention - follow up with loop recording - Follow up with your primary care physician for stroke risk factor modification. Recommend maintain blood pressure goal  around 130/80, diabetes with hemoglobin A1c goal below 6.5% and lipids with LDL cholesterol goal below 70 mg/dL.  - check BP at home, avoid low BP - we recommended to drive during the day not at night, no long distance and drive in familiar roads. - follow up in 6 months.  I spent more than 25 minutes of face to face time with the patient. Greater than 50% of time was spent in counseling and coordination of care.   No orders of the defined types were placed in this encounter.    No orders of the defined types were placed in this encounter.    Patient Instructions  - continue plavix and lipitor for stroke prevention - follow up with loop recording - Follow up with your primary care physician for stroke risk factor modification. Recommend maintain blood pressure goal <130/80, diabetes with hemoglobin A1c goal below 6.5% and lipids with LDL cholesterol goal below 70 mg/dL.  - check BP at home, avoid low BP - we recommended to drive during the day not at night, no long distance and drive in familiar roads. - follow up in 6 months.    Rosalin Hawking, MD PhD [facility] [address], [place],  [address] [phone]

## 2015-09-01 LAB — CUP PACEART REMOTE DEVICE CHECK: Date Time Interrogation Session: 20161129213910

## 2015-09-02 ENCOUNTER — Ambulatory Visit (INDEPENDENT_AMBULATORY_CARE_PROVIDER_SITE_OTHER): Payer: Medicare HMO | Admitting: *Deleted

## 2015-09-02 DIAGNOSIS — I639 Cerebral infarction, unspecified: Secondary | ICD-10-CM

## 2015-09-02 NOTE — Progress Notes (Signed)
Carelink Summary Report / Loop Recorder 

## 2015-09-17 LAB — CUP PACEART REMOTE DEVICE CHECK: Date Time Interrogation Session: 20170102073513

## 2015-09-17 NOTE — Progress Notes (Signed)
Carelink summary report received. Battery status OK. Normal device function. No new symptom episodes, tachy episodes, brady, or pause episodes. No new AF episodes. Monthly summary reports and ROV/PRN 

## 2015-10-02 ENCOUNTER — Ambulatory Visit (INDEPENDENT_AMBULATORY_CARE_PROVIDER_SITE_OTHER): Payer: Medicare HMO | Admitting: *Deleted

## 2015-10-02 DIAGNOSIS — I639 Cerebral infarction, unspecified: Secondary | ICD-10-CM

## 2015-10-02 NOTE — Progress Notes (Signed)
Carelink Summary Report / Loop Recorder 

## 2015-10-06 NOTE — Progress Notes (Signed)
Carelink summary report received. Battery status OK. Normal device function. No new symptom episodes, tachy episodes, brady, or pause episodes. No new AF episodes. Monthly summary reports and ROV/PRN 

## 2015-10-09 LAB — CUP PACEART REMOTE DEVICE CHECK: MDC IDC SESS DTM: 20170303080521

## 2015-10-09 NOTE — Progress Notes (Signed)
Carelink summary report received. Battery status OK. Normal device function. No new symptom episodes, tachy episodes, brady, or pause episodes. No new AF episodes. Monthly summary reports and ROV/PRN 

## 2015-10-19 DIAGNOSIS — R69 Illness, unspecified: Secondary | ICD-10-CM | POA: Diagnosis not present

## 2015-10-19 DIAGNOSIS — Z6835 Body mass index (BMI) 35.0-35.9, adult: Secondary | ICD-10-CM | POA: Diagnosis not present

## 2015-10-19 DIAGNOSIS — G4700 Insomnia, unspecified: Secondary | ICD-10-CM | POA: Diagnosis not present

## 2015-10-19 DIAGNOSIS — I10 Essential (primary) hypertension: Secondary | ICD-10-CM | POA: Diagnosis not present

## 2015-10-19 DIAGNOSIS — K219 Gastro-esophageal reflux disease without esophagitis: Secondary | ICD-10-CM | POA: Diagnosis not present

## 2015-10-20 ENCOUNTER — Ambulatory Visit (INDEPENDENT_AMBULATORY_CARE_PROVIDER_SITE_OTHER): Payer: Medicare HMO | Admitting: Neurology

## 2015-10-20 ENCOUNTER — Encounter: Payer: Self-pay | Admitting: Neurology

## 2015-10-20 VITALS — BP 144/91 | HR 68 | Ht 74.0 in | Wt 236.0 lb

## 2015-10-20 DIAGNOSIS — F329 Major depressive disorder, single episode, unspecified: Secondary | ICD-10-CM | POA: Diagnosis not present

## 2015-10-20 DIAGNOSIS — I679 Cerebrovascular disease, unspecified: Secondary | ICD-10-CM

## 2015-10-20 DIAGNOSIS — I63431 Cerebral infarction due to embolism of right posterior cerebral artery: Secondary | ICD-10-CM

## 2015-10-20 DIAGNOSIS — E785 Hyperlipidemia, unspecified: Secondary | ICD-10-CM | POA: Diagnosis not present

## 2015-10-20 DIAGNOSIS — I10 Essential (primary) hypertension: Secondary | ICD-10-CM

## 2015-10-20 DIAGNOSIS — F411 Generalized anxiety disorder: Secondary | ICD-10-CM | POA: Diagnosis not present

## 2015-10-20 DIAGNOSIS — F32A Depression, unspecified: Secondary | ICD-10-CM

## 2015-10-20 NOTE — Progress Notes (Signed)
STROKE NEUROLOGY FOLLOW UP NOTE  NAME: [name]. DOB: 1946-03-03  REASON FOR VISIT: stroke follow up HISTORY FROM: Patient and chart  Today we had the pleasure of seeing [name]. in follow-up at our Neurology Clinic. Pt was accompanied by wife.   History Summary Mr. [name] is a 70 y.o. male with history of hypertension was admitted on 04/02/2015 for transient visual changes and speech difficulties. Symptoms resolved. However, initial symptoms resemble migraine aura. MRI showed right occipital and temporal as well as right thalamic infarct, involving right PCA and MCA, possibly embolic pattern. MRI showed right PCA occlusion and left P2 high-grade stenosis. CT of the neck no significance extracranial ICA stenosis. 2-D Echo unremarkable, LDL 124 A1c 5.7. Due to intracranial stenosis, he was started on dual antiplatelet and continued Lipitor 80 on discharge. He was recommended to have outpatient TEE and loop recorder placement.  05/06/15 follow up - the patient has been doing well. No recurrent stroke symptoms. He had TEE done which did not show cardiac source of emboli, no PFO. Subsequently, he had loop recorder placed although suffered with postop bleeding. Blood pressure 143/82 in clinic today is on Avapro and HCTZ for blood pressure control. Still has intermittent anxiety, taking when necessary Xanax. No other complains.  08/12/15 follow up -   pt has been doing OK. On 05/15/15 he was in [employer] out while he had 2-3 seconds of right-sided visual field blurriness and blackout. It quickly resolved, he then felt mild dizziness. He went home and slept for 2 hours and back to baseline. Due to bilateral PCA high grade stenosis / occlusion, his episode was considered to be likely related to low BP. He was told to be hydrated and monitor BP at home with goal BP 120-150. He was on HCTZ 25 and avapro decreased from 300 to 150mg  at that time.  Stated that it happened again 2  weeks ago with quickly bending over to reach inside of oven, felt dizziness, backed up, stood up, some wooziness, lasting < 30 sec. BP 117/72 at that time. He did noted similar episodes with quick bending or standing up in the past. Today BP 130/90.  Interval History During the interval time, pt denies any recurrent stroke like symptoms. However, pt complains of sharp pain in head, lasting 5-6 seconds each, happens in different places bilateral randomly, dull pain, sometimes radiating pain from back of skull, about 10 times a day. However, it is distractible, when he had party with friends yesterday, he had no HA at all. Wife also complains of sleepiness, lack of motivation, decreased energy, change of temperament, insomnia, decreased appetite, depressed mood. Recently, he has a lot of stress from his re-financing his house and moving out. BP 144/91.  REVIEW OF SYSTEMS: Full 14 system review of systems performed and notable only for those listed below and in HPI above, all others are negative:  Constitutional:   Cardiovascular:  Ear/Nose/Throat:   Skin:  Eyes:  Respiratory:   Gastroitestinal:   Genitourinary:  Hematology/Lymphatic:   Endocrine:  Musculoskeletal:  Allergy/Immunology:   Neurological:   Psychiatric:  Sleep:   The following represents the patient's updated allergies and side effects list: Allergies  Allergen Reactions  . Hydrocodone Itching    The neurologically relevant items on the patient's problem list were reviewed on today's visit.  Neurologic Examination  A problem focused neurological exam (12 or more points of the single system neurologic examination, vital signs counts as  1 point, cranial nerves count for 8 points) was performed.  Blood pressure 144/91, pulse 68, height 6\' 2"  (1.88 m), weight 236 lb (107.049 kg).  General - Well nourished, well developed, in no apparent distress.  Ophthalmologic - Sharp disc margins OU.   Cardiovascular - Regular rate  and rhythm with no murmur.  Mental Status -  Level of arousal and orientation to time, place, and person were intact. Language including expression, naming, repetition, comprehension was assessed and found intact. Fund of Knowledge was assessed and was intact.  Cranial Nerves II - XII - II - Visual field intact OU. III, IV, VI - Extraocular movements intact. V - Facial sensation intact bilaterally. VII - Facial movement intact bilaterally. VIII - Hearing & vestibular intact bilaterally. X - Palate elevates symmetrically. XI - Chin turning & shoulder shrug intact bilaterally. XII - Tongue protrusion intact.  Motor Strength - The patient's strength was normal in all extremities and pronator drift was absent.  Bulk was normal and fasciculations were absent.   Motor Tone - Muscle tone was assessed at the neck and appendages and was normal.  Reflexes - The patient's reflexes were 1+ in all extremities and he had no pathological reflexes.  Sensory - Light touch, temperature/pinprick were assessed and were normal.    Coordination - The patient had normal movements in the hands and feet with no ataxia or dysmetria.  Tremor was absent.  Gait and Station - The patient's transfers, posture, gait, station, and turns were observed as normal.  Data reviewed: I personally reviewed the images and agree with the radiology interpretations.  Ct Head Wo Contrast 04/02/2015  Retention cyst in posterior left maxillary antrum. No intracranial mass, hemorrhage, or focal gray - white compartment lesions/acute appearing infarct.   Mr [name] Wo Contrast 04/02/2015  MRI HEAD  Acute nonhemorrhagic infarct involving portions of the right occipital lobe and right thalamus. Complex polypoid opacification inferior aspect left maxillary sinus.  MRA HEAD  Intracranial atherosclerotic type changes most notable posterior circulation including: High-grade long segment stenosis majority of the right  posterior cerebral artery. High grade focal stenosis P2 segment left posterior cerebral artery   CUS - Bilateral: 1-39% ICA stenosis. Vertebral artery flow is antegrade.  2D echo - - Left ventricle: The cavity size was normal. There was mild concentric hypertrophy. Systolic function was normal. The estimated ejection fraction was in the range of 50% to 55%. Wall motion was normal; there were no regional wall motion abnormalities. Doppler parameters are consistent with abnormal left ventricular relaxation (grade 1 diastolic dysfunction). - Left atrium: The atrium was mildly dilated. - Right ventricle: The cavity size was mildly dilated. Wall thickness was normal. Impressions: - No cardiac source of emboli was indentified.  TEE - No intracardiac source of emboli was identified. No PFO. Negative bubble study.  Component     Latest Ref Rng 04/03/2015  Cholesterol     0 - 200 mg/dL 183  Triglycerides     <150 mg/dL 123  HDL Cholesterol     >40 mg/dL 34 (L)  Total CHOL/HDL Ratio      5.4  VLDL     0 - 40 mg/dL 25  LDL (calc)     0 - 99 mg/dL 124 (H)  Hemoglobin A1C     4.8 - 5.6 % 5.7 (H)  Mean Plasma Glucose      117  Vitamin B-12     180 - 914 pg/mL 449    Assessment: As  you may recall, he is a 71 y.o. Caucasian male with PMH of HTN was admitted on 04/02/2015 for transient visual changes and speech difficulties. Symptoms resolved. However, initial symptoms resemble migraine aura. MRI showed right occipital and temporal as well as right thalamic infarct, involving right PCA and MCA, possibly embolic pattern. MRA showed right PCA occlusion and left P2 high-grade stenosis. CT of the neck no significance extracranial ICA stenosis. 2-D Echo unremarkable, LDL 124 A1c 5.7. Due to intracranial stenosis, he was started on dual antiplatelet and continued Lipitor 80 on discharge. Had outpatient TEE done negative and loop recorder placement. So far no afib recorded. Finished  course of dual antiplatelet, on plavix now.   Had a couple of episode of transient lightheadedness and blurry vision or lose of vision, at least one time with position changes, likely due to hypotension in the setting of b/l PCA high grade stenosis vs. Occlusion. Recommend BP monitoring, hydration.   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  Plan:  - continue plavix and lipitor for stroke prevention - relaxation and cope with stress. If need any medication for help, he will let us know.  - Follow up with your primary care physician for stroke risk factor modification. Recommend maintain blood pressure goal around 130/80, diabetes with hemoglobin A1c goal below 6.5% and lipids with LDL cholesterol goal below 70 mg/dL.  - check BP at home, avoid low BP. - follow up on 02/09/16 as scheduled  I spent more than 25 minutes of face to face time with the patient. Greater than 50% of time was spent in counseling and coordination of care. We had long discussion about recognizing depression symptoms, how to cope with it and treatment choices.   No orders of the defined types were placed in this encounter.    Meds ordered this encounter  Medications  . albuterol (PROVENTIL HFA;VENTOLIN HFA) 108 (90 Base) MCG/ACT inhaler    Sig: Inhale into the lungs.  . Aspirin-Acetaminophen-Caffeine (EXCEDRIN PO)    Sig: Take 325 mg by mouth.    Patient Instructions  - continue plavix and lipitor for stroke prevention - relaxation and cope with stress. If you need any medication for help, please let us know.  - Follow up with your primary care physician for stroke risk factor modification. Recommend maintain blood pressure goal around 130/80, diabetes with hemoglobin A1c goal below 6.5% and lipids with LDL cholesterol goal below 70 mg/dL.  - check BP at home, avoid low BP. - follow up on 02/09/16 as  scheduled    Rosalin Hawking, MD PhD [facility] [address] [place], [place] [address] [phone]

## 2015-10-20 NOTE — Patient Instructions (Addendum)
-   continue plavix and lipitor for stroke prevention - relaxation and cope with stress. If you need any medication for help, please let us know.  - Follow up with your primary care physician for stroke risk factor modification. Recommend maintain blood pressure goal around 130/80, diabetes with hemoglobin A1c goal below 6.5% and lipids with LDL cholesterol goal below 70 mg/dL.  - check BP at home, avoid low BP. - follow up on 02/09/16 as scheduled

## 2015-10-20 NOTE — Telephone Encounter (Signed)
Made in error

## 2015-10-21 DIAGNOSIS — F329 Major depressive disorder, single episode, unspecified: Secondary | ICD-10-CM | POA: Insufficient documentation

## 2015-10-21 DIAGNOSIS — F32A Depression, unspecified: Secondary | ICD-10-CM | POA: Insufficient documentation

## 2015-11-02 ENCOUNTER — Ambulatory Visit (INDEPENDENT_AMBULATORY_CARE_PROVIDER_SITE_OTHER): Payer: Medicare HMO | Admitting: *Deleted

## 2015-11-02 DIAGNOSIS — I639 Cerebral infarction, unspecified: Secondary | ICD-10-CM | POA: Diagnosis not present

## 2015-11-02 NOTE — Progress Notes (Signed)
Carelink Summary Report / Loop Recorder 

## 2015-12-01 ENCOUNTER — Ambulatory Visit (INDEPENDENT_AMBULATORY_CARE_PROVIDER_SITE_OTHER): Payer: Medicare HMO | Admitting: *Deleted

## 2015-12-01 DIAGNOSIS — I639 Cerebral infarction, unspecified: Secondary | ICD-10-CM

## 2015-12-01 NOTE — Progress Notes (Signed)
Carelink Summary Report / Loop Recorder 

## 2015-12-04 LAB — CUP PACEART REMOTE DEVICE CHECK: Date Time Interrogation Session: 20170201080521

## 2015-12-17 DIAGNOSIS — M546 Pain in thoracic spine: Secondary | ICD-10-CM | POA: Diagnosis not present

## 2015-12-17 DIAGNOSIS — K5900 Constipation, unspecified: Secondary | ICD-10-CM | POA: Diagnosis not present

## 2015-12-17 DIAGNOSIS — X500XXA Overexertion from strenuous movement or load, initial encounter: Secondary | ICD-10-CM | POA: Diagnosis not present

## 2015-12-26 LAB — CUP PACEART REMOTE DEVICE CHECK: MDC IDC SESS DTM: 20170402083514

## 2015-12-26 NOTE — Progress Notes (Signed)
Carelink summary report received. Battery status OK. Normal device function. No new symptom episodes, tachy episodes, brady, or pause episodes. No new AF episodes. Monthly summary reports and ROV/PRN 

## 2015-12-31 ENCOUNTER — Ambulatory Visit (INDEPENDENT_AMBULATORY_CARE_PROVIDER_SITE_OTHER): Payer: Medicare HMO | Admitting: *Deleted

## 2015-12-31 DIAGNOSIS — I639 Cerebral infarction, unspecified: Secondary | ICD-10-CM

## 2016-01-01 NOTE — Progress Notes (Signed)
Carelink Summary Report / Loop Recorder 

## 2016-01-10 LAB — CUP PACEART REMOTE DEVICE CHECK: MDC IDC SESS DTM: 20170502090511

## 2016-01-10 NOTE — Progress Notes (Signed)
Carelink summary report received. Battery status OK. Normal device function. No new symptom episodes, tachy episodes, brady, or pause episodes. No new AF episodes. Monthly summary reports and ROV/PRN 

## 2016-02-01 ENCOUNTER — Ambulatory Visit (INDEPENDENT_AMBULATORY_CARE_PROVIDER_SITE_OTHER): Payer: Medicare HMO | Admitting: *Deleted

## 2016-02-01 DIAGNOSIS — I639 Cerebral infarction, unspecified: Secondary | ICD-10-CM | POA: Diagnosis not present

## 2016-02-01 NOTE — Progress Notes (Signed)
Carelink Summary Report / Loop Recorder 

## 2016-02-08 LAB — CUP PACEART REMOTE DEVICE CHECK: Date Time Interrogation Session: 20170701103510

## 2016-02-09 ENCOUNTER — Ambulatory Visit (INDEPENDENT_AMBULATORY_CARE_PROVIDER_SITE_OTHER): Payer: Medicare HMO | Admitting: Neurology

## 2016-02-09 ENCOUNTER — Encounter: Payer: Self-pay | Admitting: Neurology

## 2016-02-09 VITALS — BP 139/90 | HR 70 | Ht 74.0 in | Wt 232.6 lb

## 2016-02-09 DIAGNOSIS — R69 Illness, unspecified: Secondary | ICD-10-CM | POA: Diagnosis not present

## 2016-02-09 DIAGNOSIS — I10 Essential (primary) hypertension: Secondary | ICD-10-CM

## 2016-02-09 DIAGNOSIS — F329 Major depressive disorder, single episode, unspecified: Secondary | ICD-10-CM | POA: Diagnosis not present

## 2016-02-09 DIAGNOSIS — I679 Cerebrovascular disease, unspecified: Secondary | ICD-10-CM

## 2016-02-09 DIAGNOSIS — E785 Hyperlipidemia, unspecified: Secondary | ICD-10-CM

## 2016-02-09 DIAGNOSIS — F32A Depression, unspecified: Secondary | ICD-10-CM

## 2016-02-09 DIAGNOSIS — I63431 Cerebral infarction due to embolism of right posterior cerebral artery: Secondary | ICD-10-CM

## 2016-02-09 LAB — CUP PACEART REMOTE DEVICE CHECK: MDC IDC SESS DTM: 20170601093513

## 2016-02-09 NOTE — Progress Notes (Signed)
STROKE NEUROLOGY FOLLOW UP NOTE  NAME: [name]. DOB: 1946-07-22  REASON FOR VISIT: stroke follow up HISTORY FROM: Patient and chart  Today we had the pleasure of seeing Feliz Wixon. in follow-up at our Neurology Clinic. Pt was accompanied by wife.   History Summary Mr. SIMS DUPIN is a 70 y.o. male with history of hypertension was admitted on 04/02/2015 for transient visual changes and speech difficulties. Symptoms resolved. However, initial symptoms resemble migraine aura. MRI showed right occipital and temporal as well as right thalamic infarct, involving right PCA and MCA, possibly embolic pattern. MRI showed right PCA occlusion and left P2 high-grade stenosis. CT of the neck no significance extracranial ICA stenosis. 2-D Echo unremarkable, LDL 124 A1c 5.7. Due to intracranial stenosis, he was started on dual antiplatelet and continued Lipitor 80 on discharge. He was recommended to have outpatient TEE and loop recorder placement.  05/06/15 follow up - the patient has been doing well. No recurrent stroke symptoms. He had TEE done which did not show cardiac source of emboli, no PFO. Subsequently, he had loop recorder placed although suffered with postop bleeding. Blood pressure 143/82 in clinic today is on Avapro and HCTZ for blood pressure control. Still has intermittent anxiety, taking when necessary Xanax. No other complains.  08/12/15 follow up -   pt has been doing OK. On 05/15/15 he was in [employer] out while he had 2-3 seconds of right-sided visual field blurriness and blackout. It quickly resolved, he then felt mild dizziness. He went home and slept for 2 hours and back to baseline. Due to bilateral PCA high grade stenosis / occlusion, his episode was considered to be likely related to low BP. He was told to be hydrated and monitor BP at home with goal BP 120-150. He was on HCTZ 25 and avapro decreased from 300 to 150mg  at that time.  Stated that it happened again 2  weeks ago with quickly bending over to reach inside of oven, felt dizziness, backed up, stood up, some wooziness, lasting < 30 sec. BP 117/72 at that time. He did noted similar episodes with quick bending or standing up in the past. Today BP 130/90.  10/20/15 follow up - pt denies any recurrent stroke like symptoms. However, pt complains of sharp pain in head, lasting 5-6 seconds each, happens in different places bilateral randomly, dull pain, sometimes radiating pain from back of skull, about 10 times a day. However, it is distractible, when he had party with friends yesterday, he had no HA at all. Wife also complains of sleepiness, lack of motivation, decreased energy, change of temperament, insomnia, decreased appetite, depressed mood. Recently, he has a lot of stress from his re-financing his house and moving out. BP 144/91.  Interval History During the interval time, pt has been doing well except one episode of visual disturbance at right visual field like seeing thing out of crystal, resolved quickly, no HA. He did not seek medical attention. He also has intermittent mild aching HA, bitemporal band-like or on the top of head. Too much activity may aggravate the HA. Sometimes feels lightheadedness, will hold on to wall and quickly resolves. BP at home 130-150, today 139/90.  REVIEW OF SYSTEMS: Full 14 system review of systems performed and notable only for those listed below and in HPI above, all others are negative:  Constitutional:  fatigue Cardiovascular:  Ear/Nose/Throat:  Ringing in ears Skin:  Eyes: blurry vision Respiratory:   Gastroitestinal:   Genitourinary:  difficulty urinating Hematology/Lymphatic:   Endocrine:  Musculoskeletal:  Allergy/Immunology:   Neurological:  HA Psychiatric:  Sleep:   The following represents the patient's updated allergies and side effects list: Allergies  Allergen Reactions  . Hydrocodone Itching    The neurologically relevant items on the  patient's problem list were reviewed on today's visit.  Neurologic Examination  A problem focused neurological exam (12 or more points of the single system neurologic examination, vital signs counts as 1 point, cranial nerves count for 8 points) was performed.  Blood pressure 139/90, pulse 70, height 6\' 2"  (1.88 m), weight 232 lb 9.6 oz (105.507 kg).  General - Well nourished, well developed, in no apparent distress.  Ophthalmologic - Sharp disc margins OU.   Cardiovascular - Regular rate and rhythm with no murmur.  Mental Status -  Level of arousal and orientation to time, place, and person were intact. Language including expression, naming, repetition, comprehension was assessed and found intact. Fund of Knowledge was assessed and was intact.  Cranial Nerves II - XII - II - Visual field intact OU. III, IV, VI - Extraocular movements intact. V - Facial sensation intact bilaterally. VII - Facial movement intact bilaterally. VIII - Hearing & vestibular intact bilaterally. X - Palate elevates symmetrically. XI - Chin turning & shoulder shrug intact bilaterally. XII - Tongue protrusion intact.  Motor Strength - The patient's strength was normal in all extremities and pronator drift was absent.  Bulk was normal and fasciculations were absent.   Motor Tone - Muscle tone was assessed at the neck and appendages and was normal.  Reflexes - The patient's reflexes were 1+ in all extremities and he had no pathological reflexes.  Sensory - Light touch, temperature/pinprick were assessed and were normal.    Coordination - The patient had normal movements in the hands and feet with no ataxia or dysmetria.  Tremor was absent.  Gait and Station - The patient's transfers, posture, gait, station, and turns were observed as normal.  Data reviewed: I personally reviewed the images and agree with the radiology interpretations.  Ct Head Wo Contrast 04/02/2015  Retention cyst in posterior left  maxillary antrum. No intracranial mass, hemorrhage, or focal gray - white compartment lesions/acute appearing infarct.   [name] Wo Contrast 04/02/2015  MRI HEAD  Acute nonhemorrhagic infarct involving portions of the right occipital lobe and right thalamus. Complex polypoid opacification inferior aspect left maxillary sinus.  MRA HEAD  Intracranial atherosclerotic type changes most notable posterior circulation including: High-grade long segment stenosis majority of the right posterior cerebral artery. High grade focal stenosis P2 segment left posterior cerebral artery   CUS - Bilateral: 1-39% ICA stenosis. Vertebral artery flow is antegrade.  2D echo - - Left ventricle: The cavity size was normal. There was mild concentric hypertrophy. Systolic function was normal. The estimated ejection fraction was in the range of 50% to 55%. Wall motion was normal; there were no regional wall motion abnormalities. Doppler parameters are consistent with abnormal left ventricular relaxation (grade 1 diastolic dysfunction). - Left atrium: The atrium was mildly dilated. - Right ventricle: The cavity size was mildly dilated. Wall thickness was normal. Impressions: - No cardiac source of emboli was indentified.  TEE - No intracardiac source of emboli was identified. No PFO. Negative bubble study.  Component     Latest Ref Rng 04/03/2015  Cholesterol     0 - 200 mg/dL 183  Triglycerides     <150 mg/dL 123  HDL Cholesterol     >  40 mg/dL 34 (L)  Total CHOL/HDL Ratio      5.4  VLDL     0 - 40 mg/dL 25  LDL (calc)     0 - 99 mg/dL 124 (H)  Hemoglobin A1C     4.8 - 5.6 % 5.7 (H)  Mean Plasma Glucose      117  Vitamin B-12     180 - 914 pg/mL 449    Assessment: As you may recall, he is a 70 y.o. Caucasian male with PMH of HTN was admitted on 04/02/2015 for transient visual changes and speech difficulties. Symptoms resolved. However, initial symptoms resemble migraine  aura. MRI showed right occipital and temporal as well as right thalamic infarct, involving right PCA and MCA, possibly embolic pattern. MRA showed right PCA occlusion and left P2 high-grade stenosis. CT of the neck no significance extracranial ICA stenosis. 2-D Echo unremarkable, LDL 124 A1c 5.7. Due to intracranial stenosis, he was started on dual antiplatelet and continued Lipitor 80 on discharge. Had outpatient TEE done negative and loop recorder placement. So far no afib recorded. Finished course of dual antiplatelet, on plavix now.   Had a couple of episode of transient lightheadedness and blurry vision or lose of vision, at least one time with position changes, likely due to hypotension in the setting of b/l PCA high grade stenosis vs. Occlusion. Recommend BP monitoring, hydration. Pt does have visual disturbance typical for visual aura, will continue to monitor. If more frequent, may consider MRI and migraine prophylaxis.   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 Also complains of mild HA, more consistent with tension HA.   Plan:  - continue plavix and lipitor for stroke prevention - check BP at home and record - avoid low BP and keep hydrated.  - relaxation and cope with stress.  - Follow up with your primary care physician for stroke risk factor modification. Recommend maintain blood pressure goal around 130/80, diabetes with hemoglobin A1c goal below 6.5% and lipids with LDL cholesterol goal below 70 mg/dL.  - healthy diet and regular exercise - follow up in 6 months.   I spent more than 25 minutes of face to face time with the patient. Greater than 50% of time was spent in counseling and coordination of care. We had long discussion about HA management, and cope with stress and depression.   No orders of the defined types were placed in this encounter.    Meds ordered this encounter    Medications  . pantoprazole (PROTONIX) 40 MG tablet    Sig:     There are no Patient Instructions on file for this visit.  Rosalin Hawking, MD PhD [facility] [address] [place], [place] [address] [phone]

## 2016-02-09 NOTE — Patient Instructions (Addendum)
-   continue plavix and lipitor for stroke prevention - check BP at home and record - avoid low BP and keep hydrated.  - relaxation and cope with stress.  - Follow up with your primary care physician for stroke risk factor modification. Recommend maintain blood pressure goal around 130/80, diabetes with hemoglobin A1c goal below 6.5% and lipids with LDL cholesterol goal below 70 mg/dL.  - healthy diet and regular exercise - follow up in 6 months.

## 2016-02-18 ENCOUNTER — Telehealth: Payer: Self-pay | Admitting: Cardiology

## 2016-02-18 NOTE — Telephone Encounter (Signed)
Spoke w/ pt and requested that he send a manual transmission b/c his home monitor has not updated in at least 14 days.   

## 2016-02-29 ENCOUNTER — Ambulatory Visit (INDEPENDENT_AMBULATORY_CARE_PROVIDER_SITE_OTHER): Payer: Medicare HMO | Admitting: *Deleted

## 2016-02-29 DIAGNOSIS — I639 Cerebral infarction, unspecified: Secondary | ICD-10-CM | POA: Diagnosis not present

## 2016-02-29 NOTE — Progress Notes (Signed)
Carelink Summary Report / Loop Recorder 

## 2016-03-07 LAB — CUP PACEART REMOTE DEVICE CHECK: MDC IDC SESS DTM: 20170731103539

## 2016-03-11 DIAGNOSIS — I10 Essential (primary) hypertension: Secondary | ICD-10-CM | POA: Diagnosis not present

## 2016-03-11 DIAGNOSIS — I959 Hypotension, unspecified: Secondary | ICD-10-CM | POA: Diagnosis not present

## 2016-03-11 DIAGNOSIS — Z6835 Body mass index (BMI) 35.0-35.9, adult: Secondary | ICD-10-CM | POA: Diagnosis not present

## 2016-03-11 DIAGNOSIS — R42 Dizziness and giddiness: Secondary | ICD-10-CM | POA: Diagnosis not present

## 2016-03-18 DIAGNOSIS — Z125 Encounter for screening for malignant neoplasm of prostate: Secondary | ICD-10-CM | POA: Diagnosis not present

## 2016-03-18 DIAGNOSIS — I10 Essential (primary) hypertension: Secondary | ICD-10-CM | POA: Diagnosis not present

## 2016-03-18 DIAGNOSIS — E784 Other hyperlipidemia: Secondary | ICD-10-CM | POA: Diagnosis not present

## 2016-03-24 DIAGNOSIS — Z0000 Encounter for general adult medical examination without abnormal findings: Secondary | ICD-10-CM | POA: Diagnosis not present

## 2016-03-24 DIAGNOSIS — H539 Unspecified visual disturbance: Secondary | ICD-10-CM | POA: Diagnosis not present

## 2016-03-24 DIAGNOSIS — Z6833 Body mass index (BMI) 33.0-33.9, adult: Secondary | ICD-10-CM | POA: Diagnosis not present

## 2016-03-24 DIAGNOSIS — M19041 Primary osteoarthritis, right hand: Secondary | ICD-10-CM | POA: Diagnosis not present

## 2016-03-24 DIAGNOSIS — D692 Other nonthrombocytopenic purpura: Secondary | ICD-10-CM | POA: Diagnosis not present

## 2016-03-24 DIAGNOSIS — Z125 Encounter for screening for malignant neoplasm of prostate: Secondary | ICD-10-CM | POA: Diagnosis not present

## 2016-03-24 DIAGNOSIS — I4510 Unspecified right bundle-branch block: Secondary | ICD-10-CM | POA: Diagnosis not present

## 2016-03-24 DIAGNOSIS — M19042 Primary osteoarthritis, left hand: Secondary | ICD-10-CM | POA: Diagnosis not present

## 2016-03-24 DIAGNOSIS — R69 Illness, unspecified: Secondary | ICD-10-CM | POA: Diagnosis not present

## 2016-03-24 DIAGNOSIS — I639 Cerebral infarction, unspecified: Secondary | ICD-10-CM | POA: Diagnosis not present

## 2016-03-30 ENCOUNTER — Ambulatory Visit (INDEPENDENT_AMBULATORY_CARE_PROVIDER_SITE_OTHER): Payer: Medicare HMO | Admitting: *Deleted

## 2016-03-30 DIAGNOSIS — I639 Cerebral infarction, unspecified: Secondary | ICD-10-CM | POA: Diagnosis not present

## 2016-03-30 NOTE — Progress Notes (Signed)
Carelink Summary Report / Loop Recorder 

## 2016-04-21 ENCOUNTER — Encounter: Payer: Self-pay | Admitting: Internal Medicine

## 2016-04-23 LAB — CUP PACEART REMOTE DEVICE CHECK: Date Time Interrogation Session: 20170830103657

## 2016-04-23 NOTE — Progress Notes (Signed)
Carelink summary report received. Battery status OK. Normal device function. No new symptom episodes, tachy episodes, brady, or pause episodes. No new AF episodes. Monthly summary reports and ROV/PRN 

## 2016-04-29 ENCOUNTER — Ambulatory Visit (INDEPENDENT_AMBULATORY_CARE_PROVIDER_SITE_OTHER): Payer: Medicare HMO | Admitting: *Deleted

## 2016-04-29 DIAGNOSIS — I639 Cerebral infarction, unspecified: Secondary | ICD-10-CM | POA: Diagnosis not present

## 2016-04-29 NOTE — Progress Notes (Signed)
Carelink Summary Report / Loop Recorder 

## 2016-05-30 ENCOUNTER — Ambulatory Visit (INDEPENDENT_AMBULATORY_CARE_PROVIDER_SITE_OTHER): Payer: Medicare HMO | Admitting: *Deleted

## 2016-05-30 DIAGNOSIS — I639 Cerebral infarction, unspecified: Secondary | ICD-10-CM | POA: Diagnosis not present

## 2016-05-31 NOTE — Progress Notes (Signed)
Carelink Summary Report / Loop Recorder 

## 2016-06-10 LAB — CUP PACEART REMOTE DEVICE CHECK
Implantable Pulse Generator Implant Date: 20161003
MDC IDC SESS DTM: 20170929113741

## 2016-06-10 NOTE — Progress Notes (Signed)
Carelink summary report received. Battery status OK. Normal device function. No new symptom episodes, tachy episodes, brady, or pause episodes. No new AF episodes. Monthly summary reports and ROV/PRN 

## 2016-06-15 DIAGNOSIS — N390 Urinary tract infection, site not specified: Secondary | ICD-10-CM | POA: Diagnosis not present

## 2016-06-15 DIAGNOSIS — R109 Unspecified abdominal pain: Secondary | ICD-10-CM | POA: Diagnosis not present

## 2016-06-15 DIAGNOSIS — I639 Cerebral infarction, unspecified: Secondary | ICD-10-CM | POA: Diagnosis not present

## 2016-06-15 DIAGNOSIS — N401 Enlarged prostate with lower urinary tract symptoms: Secondary | ICD-10-CM | POA: Diagnosis not present

## 2016-06-15 DIAGNOSIS — J309 Allergic rhinitis, unspecified: Secondary | ICD-10-CM | POA: Diagnosis not present

## 2016-06-15 DIAGNOSIS — H9319 Tinnitus, unspecified ear: Secondary | ICD-10-CM | POA: Diagnosis not present

## 2016-06-15 DIAGNOSIS — Z6833 Body mass index (BMI) 33.0-33.9, adult: Secondary | ICD-10-CM | POA: Diagnosis not present

## 2016-06-15 DIAGNOSIS — R51 Headache: Secondary | ICD-10-CM | POA: Diagnosis not present

## 2016-06-15 DIAGNOSIS — R8299 Other abnormal findings in urine: Secondary | ICD-10-CM | POA: Diagnosis not present

## 2016-06-21 DIAGNOSIS — I739 Peripheral vascular disease, unspecified: Secondary | ICD-10-CM | POA: Diagnosis not present

## 2016-06-21 DIAGNOSIS — M79674 Pain in right toe(s): Secondary | ICD-10-CM | POA: Diagnosis not present

## 2016-06-21 DIAGNOSIS — M79675 Pain in left toe(s): Secondary | ICD-10-CM | POA: Diagnosis not present

## 2016-06-21 DIAGNOSIS — L600 Ingrowing nail: Secondary | ICD-10-CM | POA: Diagnosis not present

## 2016-06-28 ENCOUNTER — Ambulatory Visit (INDEPENDENT_AMBULATORY_CARE_PROVIDER_SITE_OTHER): Payer: Medicare HMO | Admitting: *Deleted

## 2016-06-28 DIAGNOSIS — I639 Cerebral infarction, unspecified: Secondary | ICD-10-CM | POA: Diagnosis not present

## 2016-06-29 NOTE — Progress Notes (Signed)
Carelink Summary Report / Loop Recorder 

## 2016-07-07 LAB — CUP PACEART REMOTE DEVICE CHECK
MDC IDC PG IMPLANT DT: 20161003
MDC IDC SESS DTM: 20171029113804

## 2016-07-07 NOTE — Progress Notes (Signed)
Carelink summary report received. Battery status OK. Normal device function. No new symptom episodes, tachy episodes, brady, or pause episodes. No new AF episodes. Monthly summary reports and ROV/PRN 

## 2016-07-28 ENCOUNTER — Ambulatory Visit (INDEPENDENT_AMBULATORY_CARE_PROVIDER_SITE_OTHER): Payer: Medicare HMO | Admitting: *Deleted

## 2016-07-28 DIAGNOSIS — I639 Cerebral infarction, unspecified: Secondary | ICD-10-CM | POA: Diagnosis not present

## 2016-07-28 NOTE — Progress Notes (Signed)
Carelink Summary Report / Loop Recorder 

## 2016-08-08 LAB — CUP PACEART REMOTE DEVICE CHECK
Implantable Pulse Generator Implant Date: 20161003
MDC IDC SESS DTM: 20171128123658

## 2016-08-22 ENCOUNTER — Ambulatory Visit (INDEPENDENT_AMBULATORY_CARE_PROVIDER_SITE_OTHER): Payer: Medicare HMO | Admitting: Neurology

## 2016-08-22 ENCOUNTER — Encounter: Payer: Self-pay | Admitting: Neurology

## 2016-08-22 VITALS — BP 137/86 | HR 75 | Wt 230.0 lb

## 2016-08-22 DIAGNOSIS — I6350 Cerebral infarction due to unspecified occlusion or stenosis of unspecified cerebral artery: Secondary | ICD-10-CM

## 2016-08-22 DIAGNOSIS — I10 Essential (primary) hypertension: Secondary | ICD-10-CM | POA: Diagnosis not present

## 2016-08-22 DIAGNOSIS — I679 Cerebrovascular disease, unspecified: Secondary | ICD-10-CM

## 2016-08-22 DIAGNOSIS — F411 Generalized anxiety disorder: Secondary | ICD-10-CM

## 2016-08-22 DIAGNOSIS — E785 Hyperlipidemia, unspecified: Secondary | ICD-10-CM

## 2016-08-22 DIAGNOSIS — R69 Illness, unspecified: Secondary | ICD-10-CM | POA: Diagnosis not present

## 2016-08-22 NOTE — Progress Notes (Signed)
STROKE NEUROLOGY FOLLOW UP NOTE  NAME: [name]. DOB: 1945/11/09  REASON FOR VISIT: stroke follow up HISTORY FROM: Patient and chart  Today we had the pleasure of seeing Lio Guess. in follow-up at our Neurology Clinic. Pt was accompanied by wife.   History Summary Mr. LEAL PASCALL is a 71 y.o. male with history of hypertension was admitted on 04/02/2015 for transient visual changes and speech difficulties. Symptoms resolved. However, initial symptoms resemble migraine aura. MRI showed right occipital and temporal as well as right thalamic infarct, involving right PCA and MCA, possibly embolic pattern. MRI showed right PCA occlusion and left P2 high-grade stenosis. CT of the neck no significance extracranial ICA stenosis. 2-D Echo unremarkable, LDL 124 A1c 5.7. Due to intracranial stenosis, he was started on dual antiplatelet and continued Lipitor 80 on discharge. He was recommended to have outpatient TEE and loop recorder placement.  05/06/15 follow up - the patient has been doing well. No recurrent stroke symptoms. He had TEE done which did not show cardiac source of emboli, no PFO. Subsequently, he had loop recorder placed although suffered with postop bleeding. Blood pressure 143/82 in clinic today is on Avapro and HCTZ for blood pressure control. Still has intermittent anxiety, taking when necessary Xanax. No other complains.  08/12/15 follow up -   pt has been doing OK. On 05/15/15 he was in [employer] out while he had 2-3 seconds of right-sided visual field blurriness and blackout. It quickly resolved, he then felt mild dizziness. He went home and slept for 2 hours and back to baseline. Due to bilateral PCA high grade stenosis / occlusion, his episode was considered to be likely related to low BP. He was told to be hydrated and monitor BP at home with goal BP 120-150. He was on HCTZ 25 and avapro decreased from 300 to 150mg  at that time.  Stated that it happened again 2  weeks ago with quickly bending over to reach inside of oven, felt dizziness, backed up, stood up, some wooziness, lasting < 30 sec. BP 117/72 at that time. He did noted similar episodes with quick bending or standing up in the past. Today BP 130/90.  10/20/15 follow up - pt denies any recurrent stroke like symptoms. However, pt complains of sharp pain in head, lasting 5-6 seconds each, happens in different places bilateral randomly, dull pain, sometimes radiating pain from back of skull, about 10 times a day. However, it is distractible, when he had party with friends yesterday, he had no HA at all. Wife also complains of sleepiness, lack of motivation, decreased energy, change of temperament, insomnia, decreased appetite, depressed mood. Recently, he has a lot of stress from his re-financing his house and moving out. BP 144/91.  02/09/16 follow up - pt has been doing well except one episode of visual disturbance at right visual field like seeing thing out of crystal, resolved quickly, no HA. He did not seek medical attention. He also has intermittent mild aching HA, bitemporal band-like or on the top of head. Too much activity may aggravate the HA. Sometimes feels lightheadedness, will hold on to wall and quickly resolves. BP at home 130-150, today 139/90.  Interval History During the interval time, patient has been doing well. Headache is better, no headache for the last several months. Still has intermittent visual changes with imaging of broken glass in the middle of vision field and then gradually subsided to the side of visual field, lasting 10 minutes  without subsequent headache, consistent with visual aura. She still complains of dizziness when standing up from bending position for long time. Complaining of a couple time of difficult word retrieval in the setting of anxiety and fatigue.   REVIEW OF SYSTEMS: Full 14 system review of systems performed and notable only for those listed below and in HPI  above, all others are negative:  Constitutional:  Cardiovascular:  Ear/Nose/Throat:  Runny nose Skin:  Eyes: light sensitivity Respiratory:   Gastroitestinal:   Genitourinary:  Hematology/Lymphatic:   Endocrine:  Musculoskeletal:  Allergy/Immunology:   Neurological:  Speech difficulty Psychiatric:  Sleep:   The following represents the patient's updated allergies and side effects list: Allergies  Allergen Reactions  . Hydrocodone Itching    The neurologically relevant items on the patient's problem list were reviewed on today's visit.  Neurologic Examination  A problem focused neurological exam (12 or more points of the single system neurologic examination, vital signs counts as 1 point, cranial nerves count for 8 points) was performed.  Blood pressure 137/86, pulse 75, weight 230 lb (104.3 kg).  General - Well nourished, well developed, in no apparent distress.  Ophthalmologic - Sharp disc margins OU.   Cardiovascular - Regular rate and rhythm with no murmur.  Mental Status -  Level of arousal and orientation to time, place, and person were intact. Language including expression, naming, repetition, comprehension was assessed and found intact. Fund of Knowledge was assessed and was intact.  Cranial Nerves II - XII - II - Visual field intact OU. III, IV, VI - Extraocular movements intact. V - Facial sensation intact bilaterally. VII - Facial movement intact bilaterally. VIII - Hearing & vestibular intact bilaterally. X - Palate elevates symmetrically. XI - Chin turning & shoulder shrug intact bilaterally. XII - Tongue protrusion intact.  Motor Strength - The patient's strength was normal in all extremities and pronator drift was absent.  Bulk was normal and fasciculations were absent.   Motor Tone - Muscle tone was assessed at the neck and appendages and was normal.  Reflexes - The patient's reflexes were 1+ in all extremities and he had no pathological  reflexes.  Sensory - Light touch, temperature/pinprick were assessed and were normal.    Coordination - The patient had normal movements in the hands and feet with no ataxia or dysmetria.  Tremor was absent.  Gait and Station - The patient's transfers, posture, gait, station, and turns were observed as normal.  Data reviewed: I personally reviewed the images and agree with the radiology interpretations.  Ct Head Wo Contrast 04/02/2015  Retention cyst in posterior left maxillary antrum. No intracranial mass, hemorrhage, or focal gray - white compartment lesions/acute appearing infarct.   [name] Wo Contrast 04/02/2015  MRI HEAD  Acute nonhemorrhagic infarct involving portions of the right occipital lobe and right thalamus. Complex polypoid opacification inferior aspect left maxillary sinus.  MRA HEAD  Intracranial atherosclerotic type changes most notable posterior circulation including: High-grade long segment stenosis majority of the right posterior cerebral artery. High grade focal stenosis P2 segment left posterior cerebral artery   CUS - Bilateral: 1-39% ICA stenosis. Vertebral artery flow is antegrade.  2D echo - - Left ventricle: The cavity size was normal. There was mild concentric hypertrophy. Systolic function was normal. The estimated ejection fraction was in the range of 50% to 55%. Wall motion was normal; there were no regional wall motion abnormalities. Doppler parameters are consistent with abnormal left ventricular relaxation (grade 1 diastolic dysfunction). -  Left atrium: The atrium was mildly dilated. - Right ventricle: The cavity size was mildly dilated. Wall thickness was normal. Impressions: - No cardiac source of emboli was indentified.  TEE - No intracardiac source of emboli was identified. No PFO. Negative bubble study.  Component     Latest Ref Rng 04/03/2015  Cholesterol     0 - 200 mg/dL 183  Triglycerides     <150 mg/dL  123  HDL Cholesterol     >40 mg/dL 34 (L)  Total CHOL/HDL Ratio      5.4  VLDL     0 - 40 mg/dL 25  LDL (calc)     0 - 99 mg/dL 124 (H)  Hemoglobin A1C     4.8 - 5.6 % 5.7 (H)  Mean Plasma Glucose      117  Vitamin B-12     180 - 914 pg/mL 449    Assessment: As you may recall, he is a 71 y.o. Caucasian male with PMH of HTN was admitted on 04/02/2015 for transient visual changes and speech difficulties. Symptoms resolved. However, initial symptoms resemble migraine aura. MRI showed right occipital and temporal as well as right thalamic infarct, involving right PCA and MCA, possibly embolic pattern. MRA showed right PCA occlusion and left P2 high-grade stenosis. CT of the neck no significance extracranial ICA stenosis. 2-D Echo unremarkable, LDL 124 A1c 5.7. Due to intracranial stenosis, he was started on dual antiplatelet and continued Lipitor 80 on discharge. Had outpatient TEE done negative and loop recorder placement. So far no afib recorded. Finished course of dual antiplatelet, on plavix now.   Had a couple of episode of transient lightheadedness and blurry vision or lose of vision, at least one time with position changes (standing up after bending position), likely due to hypotension in the setting of b/l PCA high grade stenosis vs. Occlusion. Recommend BP monitoring, hydration. Pt does have visual disturbance typical for visual aura, will continue to monitor. If more frequent, may consider MRI and migraine prophylaxis.   Plan:  - continue plavix and lipitor for stroke prevention - check BP at home and record. Avoid dramatic position changes - relaxation and destress.  - Follow up with your primary care physician for stroke risk factor modification. Recommend maintain blood pressure goal around 130/80, diabetes with hemoglobin A1c goal below 6.5% and lipids with LDL cholesterol goal below 70 mg/dL.  - continue observation of visual aura. If too frequent or bothers too much, may  consider preventive medication. - healthy diet and regular exercise. - follow up as needed.    No orders of the defined types were placed in this encounter.   Meds ordered this encounter  Medications  . tamsulosin (FLOMAX) 0.4 MG CAPS capsule    Sig: Take 0.4 mg by mouth.  . loratadine (CLARITIN) 10 MG tablet    Sig: Take 10 mg by mouth daily.    Patient Instructions  - continue plavix and lipitor for stroke prevention - check BP at home and record. Avoid dramatic position changes - relaxation and destress.  - Follow up with your primary care physician for stroke risk factor modification. Recommend maintain blood pressure goal around 130/80, diabetes with hemoglobin A1c goal below 6.5% and lipids with LDL cholesterol goal below 70 mg/dL.  - continue observation of visual aura. If too frequent or bothers too much, may consider preventive medication. - healthy diet and regular exercise. - follow up as needed.    Rosalin Hawking, MD PhD [facility] [address], [place], [place] [address] [phone]

## 2016-08-22 NOTE — Patient Instructions (Addendum)
-   continue plavix and lipitor for stroke prevention - check BP at home and record. Avoid dramatic position changes - relaxation and destress.  - Follow up with your primary care physician for stroke risk factor modification. Recommend maintain blood pressure goal around 130/80, diabetes with hemoglobin A1c goal below 6.5% and lipids with LDL cholesterol goal below 70 mg/dL.  - continue observation of visual aura. If too frequent or bothers too much, may consider preventive medication. - healthy diet and regular exercise. - follow up as needed.

## 2016-08-25 DIAGNOSIS — J069 Acute upper respiratory infection, unspecified: Secondary | ICD-10-CM | POA: Diagnosis not present

## 2016-08-27 DIAGNOSIS — J9801 Acute bronchospasm: Secondary | ICD-10-CM | POA: Diagnosis not present

## 2016-08-27 DIAGNOSIS — R69 Illness, unspecified: Secondary | ICD-10-CM | POA: Diagnosis not present

## 2016-08-27 DIAGNOSIS — J159 Unspecified bacterial pneumonia: Secondary | ICD-10-CM | POA: Diagnosis not present

## 2016-08-27 DIAGNOSIS — J209 Acute bronchitis, unspecified: Secondary | ICD-10-CM | POA: Diagnosis not present

## 2016-08-29 ENCOUNTER — Ambulatory Visit (INDEPENDENT_AMBULATORY_CARE_PROVIDER_SITE_OTHER): Payer: Medicare HMO | Admitting: *Deleted

## 2016-08-29 DIAGNOSIS — I639 Cerebral infarction, unspecified: Secondary | ICD-10-CM

## 2016-08-29 NOTE — Progress Notes (Signed)
Carelink Summary Report / Loop Recorder 

## 2016-09-12 LAB — CUP PACEART REMOTE DEVICE CHECK
Implantable Pulse Generator Implant Date: 20161003
MDC IDC SESS DTM: 20171228130614

## 2016-09-26 ENCOUNTER — Ambulatory Visit (INDEPENDENT_AMBULATORY_CARE_PROVIDER_SITE_OTHER): Payer: Medicare HMO | Admitting: *Deleted

## 2016-09-26 DIAGNOSIS — R69 Illness, unspecified: Secondary | ICD-10-CM | POA: Diagnosis not present

## 2016-09-26 DIAGNOSIS — N401 Enlarged prostate with lower urinary tract symptoms: Secondary | ICD-10-CM | POA: Diagnosis not present

## 2016-09-26 DIAGNOSIS — I6350 Cerebral infarction due to unspecified occlusion or stenosis of unspecified cerebral artery: Secondary | ICD-10-CM | POA: Diagnosis not present

## 2016-09-26 DIAGNOSIS — I639 Cerebral infarction, unspecified: Secondary | ICD-10-CM

## 2016-09-26 DIAGNOSIS — J309 Allergic rhinitis, unspecified: Secondary | ICD-10-CM | POA: Diagnosis not present

## 2016-09-26 DIAGNOSIS — D692 Other nonthrombocytopenic purpura: Secondary | ICD-10-CM | POA: Diagnosis not present

## 2016-09-26 DIAGNOSIS — Z6834 Body mass index (BMI) 34.0-34.9, adult: Secondary | ICD-10-CM | POA: Diagnosis not present

## 2016-09-26 DIAGNOSIS — K219 Gastro-esophageal reflux disease without esophagitis: Secondary | ICD-10-CM | POA: Diagnosis not present

## 2016-09-26 DIAGNOSIS — I679 Cerebrovascular disease, unspecified: Secondary | ICD-10-CM | POA: Diagnosis not present

## 2016-09-26 DIAGNOSIS — H539 Unspecified visual disturbance: Secondary | ICD-10-CM | POA: Diagnosis not present

## 2016-09-26 DIAGNOSIS — E7800 Pure hypercholesterolemia, unspecified: Secondary | ICD-10-CM | POA: Diagnosis not present

## 2016-09-26 NOTE — Progress Notes (Signed)
Carelink Summary Report / Loop Recorder 

## 2016-09-28 LAB — CUP PACEART REMOTE DEVICE CHECK
Implantable Pulse Generator Implant Date: 20161003
MDC IDC SESS DTM: 20180127130812

## 2016-09-28 NOTE — Progress Notes (Signed)
Carelink summary report received. Battery status OK. Normal device function. No new symptom episodes, tachy episodes, brady, or pause episodes. No new AF episodes. Monthly summary reports and ROV/PRN 

## 2016-10-12 LAB — CUP PACEART REMOTE DEVICE CHECK
Implantable Pulse Generator Implant Date: 20161003
MDC IDC SESS DTM: 20180226130921

## 2016-10-26 ENCOUNTER — Ambulatory Visit (INDEPENDENT_AMBULATORY_CARE_PROVIDER_SITE_OTHER): Payer: Medicare HMO | Admitting: *Deleted

## 2016-10-26 DIAGNOSIS — I639 Cerebral infarction, unspecified: Secondary | ICD-10-CM

## 2016-10-27 NOTE — Progress Notes (Signed)
Carelink Summary Report / Loop Recorder 

## 2016-11-10 LAB — CUP PACEART REMOTE DEVICE CHECK
Date Time Interrogation Session: 20180328133902
MDC IDC PG IMPLANT DT: 20161003

## 2016-11-25 ENCOUNTER — Ambulatory Visit (INDEPENDENT_AMBULATORY_CARE_PROVIDER_SITE_OTHER): Payer: Medicare HMO | Admitting: *Deleted

## 2016-11-25 DIAGNOSIS — I639 Cerebral infarction, unspecified: Secondary | ICD-10-CM | POA: Diagnosis not present

## 2016-11-25 NOTE — Progress Notes (Signed)
Carelink Summary Report / Loop Recorder 

## 2016-12-08 LAB — CUP PACEART REMOTE DEVICE CHECK
Implantable Pulse Generator Implant Date: 20161003
MDC IDC SESS DTM: 20180427140725

## 2016-12-27 ENCOUNTER — Ambulatory Visit (INDEPENDENT_AMBULATORY_CARE_PROVIDER_SITE_OTHER): Payer: Medicare HMO | Admitting: *Deleted

## 2016-12-27 DIAGNOSIS — I639 Cerebral infarction, unspecified: Secondary | ICD-10-CM

## 2016-12-29 NOTE — Progress Notes (Signed)
[facility] Summary Report 

## 2016-12-30 LAB — CUP PACEART REMOTE DEVICE CHECK
Date Time Interrogation Session: 20180527143523
Implantable Pulse Generator Implant Date: 20161003

## 2017-01-24 ENCOUNTER — Ambulatory Visit (INDEPENDENT_AMBULATORY_CARE_PROVIDER_SITE_OTHER): Payer: Medicare HMO | Admitting: *Deleted

## 2017-01-24 DIAGNOSIS — L918 Other hypertrophic disorders of the skin: Secondary | ICD-10-CM | POA: Diagnosis not present

## 2017-01-24 DIAGNOSIS — L820 Inflamed seborrheic keratosis: Secondary | ICD-10-CM | POA: Diagnosis not present

## 2017-01-24 DIAGNOSIS — D485 Neoplasm of uncertain behavior of skin: Secondary | ICD-10-CM | POA: Diagnosis not present

## 2017-01-24 DIAGNOSIS — I639 Cerebral infarction, unspecified: Secondary | ICD-10-CM

## 2017-01-24 DIAGNOSIS — Z85828 Personal history of other malignant neoplasm of skin: Secondary | ICD-10-CM | POA: Diagnosis not present

## 2017-01-24 DIAGNOSIS — L570 Actinic keratosis: Secondary | ICD-10-CM | POA: Diagnosis not present

## 2017-01-24 DIAGNOSIS — L821 Other seborrheic keratosis: Secondary | ICD-10-CM | POA: Diagnosis not present

## 2017-01-24 NOTE — Progress Notes (Signed)
Carelink Summary Report / Loop Recorder 

## 2017-01-31 DIAGNOSIS — H26493 Other secondary cataract, bilateral: Secondary | ICD-10-CM | POA: Diagnosis not present

## 2017-01-31 DIAGNOSIS — H16042 Marginal corneal ulcer, left eye: Secondary | ICD-10-CM | POA: Diagnosis not present

## 2017-02-03 DIAGNOSIS — H16042 Marginal corneal ulcer, left eye: Secondary | ICD-10-CM | POA: Diagnosis not present

## 2017-02-03 DIAGNOSIS — H26493 Other secondary cataract, bilateral: Secondary | ICD-10-CM | POA: Diagnosis not present

## 2017-02-05 LAB — CUP PACEART REMOTE DEVICE CHECK
MDC IDC PG IMPLANT DT: 20161003
MDC IDC SESS DTM: 20180626144638

## 2017-02-05 NOTE — Progress Notes (Signed)
Carelink summary report received. Battery status OK. Normal device function. No new symptom episodes, tachy episodes, brady, or pause episodes. No new AF episodes. Monthly summary reports and ROV/PRN 

## 2017-02-08 DIAGNOSIS — H16042 Marginal corneal ulcer, left eye: Secondary | ICD-10-CM | POA: Diagnosis not present

## 2017-02-08 DIAGNOSIS — H26493 Other secondary cataract, bilateral: Secondary | ICD-10-CM | POA: Diagnosis not present

## 2017-02-10 DIAGNOSIS — H01119 Allergic dermatitis of unspecified eye, unspecified eyelid: Secondary | ICD-10-CM | POA: Diagnosis not present

## 2017-02-23 ENCOUNTER — Ambulatory Visit (INDEPENDENT_AMBULATORY_CARE_PROVIDER_SITE_OTHER): Payer: Medicare HMO | Admitting: *Deleted

## 2017-02-23 DIAGNOSIS — I639 Cerebral infarction, unspecified: Secondary | ICD-10-CM

## 2017-02-24 NOTE — Progress Notes (Signed)
Carelink Summary Report / Loop Recorder 

## 2017-03-02 ENCOUNTER — Telehealth: Payer: Self-pay | Admitting: Cardiology

## 2017-03-02 NOTE — Telephone Encounter (Signed)
Spoke w/ pt and requested that he send a manual transmission b/c his home monitor has not updated in at least 7 days.   

## 2017-03-08 ENCOUNTER — Encounter: Payer: Self-pay | Admitting: Cardiology

## 2017-03-09 LAB — CUP PACEART REMOTE DEVICE CHECK
Date Time Interrogation Session: 20180726153919
MDC IDC PG IMPLANT DT: 20161003

## 2017-03-16 DIAGNOSIS — H43813 Vitreous degeneration, bilateral: Secondary | ICD-10-CM | POA: Diagnosis not present

## 2017-03-16 DIAGNOSIS — H26493 Other secondary cataract, bilateral: Secondary | ICD-10-CM | POA: Diagnosis not present

## 2017-03-16 DIAGNOSIS — H35372 Puckering of macula, left eye: Secondary | ICD-10-CM | POA: Diagnosis not present

## 2017-03-16 DIAGNOSIS — D3131 Benign neoplasm of right choroid: Secondary | ICD-10-CM | POA: Diagnosis not present

## 2017-03-16 DIAGNOSIS — H5213 Myopia, bilateral: Secondary | ICD-10-CM | POA: Diagnosis not present

## 2017-03-27 ENCOUNTER — Ambulatory Visit (INDEPENDENT_AMBULATORY_CARE_PROVIDER_SITE_OTHER): Payer: Medicare HMO | Admitting: *Deleted

## 2017-03-27 DIAGNOSIS — I639 Cerebral infarction, unspecified: Secondary | ICD-10-CM

## 2017-03-27 NOTE — Progress Notes (Signed)
Carelink Summary Report / Loop Recorder 

## 2017-03-28 DIAGNOSIS — H26492 Other secondary cataract, left eye: Secondary | ICD-10-CM | POA: Diagnosis not present

## 2017-04-04 LAB — CUP PACEART REMOTE DEVICE CHECK
Implantable Pulse Generator Implant Date: 20161003
MDC IDC SESS DTM: 20180825162705

## 2017-04-24 ENCOUNTER — Ambulatory Visit (INDEPENDENT_AMBULATORY_CARE_PROVIDER_SITE_OTHER): Payer: Medicare HMO | Admitting: *Deleted

## 2017-04-24 DIAGNOSIS — I639 Cerebral infarction, unspecified: Secondary | ICD-10-CM | POA: Diagnosis not present

## 2017-04-25 LAB — CUP PACEART REMOTE DEVICE CHECK
Date Time Interrogation Session: 20180924163625
MDC IDC PG IMPLANT DT: 20161003

## 2017-04-25 NOTE — Progress Notes (Signed)
Carelink Summary Report / Loop Recorder 

## 2017-04-26 ENCOUNTER — Telehealth: Payer: Self-pay | Admitting: Cardiology

## 2017-04-26 NOTE — Telephone Encounter (Signed)
Spoke w/ pt wife and requested that he send a manual transmission b/c his home monitor has not updated in at least 14 days.   

## 2017-05-24 ENCOUNTER — Ambulatory Visit (INDEPENDENT_AMBULATORY_CARE_PROVIDER_SITE_OTHER): Payer: Medicare HMO | Admitting: *Deleted

## 2017-05-24 DIAGNOSIS — I639 Cerebral infarction, unspecified: Secondary | ICD-10-CM | POA: Diagnosis not present

## 2017-05-24 NOTE — Progress Notes (Signed)
Carelink Summary Report / Loop Recorder 

## 2017-05-25 DIAGNOSIS — C44329 Squamous cell carcinoma of skin of other parts of face: Secondary | ICD-10-CM | POA: Diagnosis not present

## 2017-05-25 DIAGNOSIS — L570 Actinic keratosis: Secondary | ICD-10-CM | POA: Diagnosis not present

## 2017-05-25 DIAGNOSIS — D485 Neoplasm of uncertain behavior of skin: Secondary | ICD-10-CM | POA: Diagnosis not present

## 2017-05-25 DIAGNOSIS — Z85828 Personal history of other malignant neoplasm of skin: Secondary | ICD-10-CM | POA: Diagnosis not present

## 2017-05-30 DIAGNOSIS — H26491 Other secondary cataract, right eye: Secondary | ICD-10-CM | POA: Diagnosis not present

## 2017-05-30 DIAGNOSIS — Z9889 Other specified postprocedural states: Secondary | ICD-10-CM | POA: Diagnosis not present

## 2017-05-30 LAB — CUP PACEART REMOTE DEVICE CHECK
Date Time Interrogation Session: 20181024163806
MDC IDC PG IMPLANT DT: 20161003

## 2017-06-20 DIAGNOSIS — Z125 Encounter for screening for malignant neoplasm of prostate: Secondary | ICD-10-CM | POA: Diagnosis not present

## 2017-06-20 DIAGNOSIS — Z0000 Encounter for general adult medical examination without abnormal findings: Secondary | ICD-10-CM | POA: Diagnosis not present

## 2017-06-20 DIAGNOSIS — E7800 Pure hypercholesterolemia, unspecified: Secondary | ICD-10-CM | POA: Diagnosis not present

## 2017-06-20 DIAGNOSIS — I6350 Cerebral infarction due to unspecified occlusion or stenosis of unspecified cerebral artery: Secondary | ICD-10-CM | POA: Diagnosis not present

## 2017-06-20 DIAGNOSIS — R82998 Other abnormal findings in urine: Secondary | ICD-10-CM | POA: Diagnosis not present

## 2017-06-20 DIAGNOSIS — M19041 Primary osteoarthritis, right hand: Secondary | ICD-10-CM | POA: Diagnosis not present

## 2017-06-20 DIAGNOSIS — N401 Enlarged prostate with lower urinary tract symptoms: Secondary | ICD-10-CM | POA: Diagnosis not present

## 2017-06-20 DIAGNOSIS — G4709 Other insomnia: Secondary | ICD-10-CM | POA: Diagnosis not present

## 2017-06-20 DIAGNOSIS — D692 Other nonthrombocytopenic purpura: Secondary | ICD-10-CM | POA: Diagnosis not present

## 2017-06-20 DIAGNOSIS — I6789 Other cerebrovascular disease: Secondary | ICD-10-CM | POA: Diagnosis not present

## 2017-06-20 DIAGNOSIS — Z23 Encounter for immunization: Secondary | ICD-10-CM | POA: Diagnosis not present

## 2017-06-20 DIAGNOSIS — R69 Illness, unspecified: Secondary | ICD-10-CM | POA: Diagnosis not present

## 2017-06-20 DIAGNOSIS — I10 Essential (primary) hypertension: Secondary | ICD-10-CM | POA: Diagnosis not present

## 2017-06-26 ENCOUNTER — Ambulatory Visit (INDEPENDENT_AMBULATORY_CARE_PROVIDER_SITE_OTHER): Payer: Medicare HMO | Admitting: *Deleted

## 2017-06-26 DIAGNOSIS — I639 Cerebral infarction, unspecified: Secondary | ICD-10-CM | POA: Diagnosis not present

## 2017-06-27 NOTE — Progress Notes (Signed)
Carelink Summary Report / Loop Recorder 

## 2017-07-06 LAB — CUP PACEART REMOTE DEVICE CHECK
Date Time Interrogation Session: 20181123170812
MDC IDC PG IMPLANT DT: 20161003

## 2017-07-24 ENCOUNTER — Ambulatory Visit (INDEPENDENT_AMBULATORY_CARE_PROVIDER_SITE_OTHER): Payer: Medicare HMO | Admitting: *Deleted

## 2017-07-24 DIAGNOSIS — I639 Cerebral infarction, unspecified: Secondary | ICD-10-CM

## 2017-07-26 NOTE — Progress Notes (Signed)
Carelink Summary Report / Loop Recorder 

## 2017-08-04 LAB — CUP PACEART REMOTE DEVICE CHECK
Date Time Interrogation Session: 20181223173820
MDC IDC PG IMPLANT DT: 20161003

## 2017-08-07 DIAGNOSIS — L400 Psoriasis vulgaris: Secondary | ICD-10-CM | POA: Diagnosis not present

## 2017-08-07 DIAGNOSIS — L570 Actinic keratosis: Secondary | ICD-10-CM | POA: Diagnosis not present

## 2017-08-07 DIAGNOSIS — L2089 Other atopic dermatitis: Secondary | ICD-10-CM | POA: Diagnosis not present

## 2017-08-07 DIAGNOSIS — D0439 Carcinoma in situ of skin of other parts of face: Secondary | ICD-10-CM | POA: Diagnosis not present

## 2017-08-07 DIAGNOSIS — Z85828 Personal history of other malignant neoplasm of skin: Secondary | ICD-10-CM | POA: Diagnosis not present

## 2017-08-07 DIAGNOSIS — D485 Neoplasm of uncertain behavior of skin: Secondary | ICD-10-CM | POA: Diagnosis not present

## 2017-08-22 ENCOUNTER — Ambulatory Visit (INDEPENDENT_AMBULATORY_CARE_PROVIDER_SITE_OTHER): Payer: Medicare HMO | Admitting: *Deleted

## 2017-08-22 DIAGNOSIS — I639 Cerebral infarction, unspecified: Secondary | ICD-10-CM

## 2017-08-23 NOTE — Progress Notes (Signed)
Carelink Summary Report / Loop Recorder 

## 2017-09-01 LAB — CUP PACEART REMOTE DEVICE CHECK
Implantable Pulse Generator Implant Date: 20161003
MDC IDC SESS DTM: 20190122184049

## 2017-09-25 ENCOUNTER — Ambulatory Visit (INDEPENDENT_AMBULATORY_CARE_PROVIDER_SITE_OTHER): Payer: Medicare HMO | Admitting: *Deleted

## 2017-09-25 DIAGNOSIS — I639 Cerebral infarction, unspecified: Secondary | ICD-10-CM | POA: Diagnosis not present

## 2017-09-25 NOTE — Progress Notes (Signed)
Carelink Summary Report / Loop Recorder 

## 2017-10-06 ENCOUNTER — Telehealth: Payer: Self-pay | Admitting: *Deleted

## 2017-10-06 NOTE — Telephone Encounter (Signed)
Spoke with patient regarding sending manual transmission to review full report. 2 tachy episodes- 1 ECG appears SR with EMI. Patient states he was hiking in [place] and walked through an [employer]. Patient states no symptoms. Patient states he will send a manual transmission tonight when he gets home. Advised we will review once we receive it and call back if need for any further recommendations.

## 2017-10-10 NOTE — Telephone Encounter (Signed)
Manual transmission received on 10/09/17.  Both "tachy" ECGs show SR w/EMI, not AF.

## 2017-10-27 ENCOUNTER — Ambulatory Visit (INDEPENDENT_AMBULATORY_CARE_PROVIDER_SITE_OTHER): Payer: Medicare HMO | Admitting: *Deleted

## 2017-10-27 DIAGNOSIS — I639 Cerebral infarction, unspecified: Secondary | ICD-10-CM

## 2017-10-30 LAB — CUP PACEART REMOTE DEVICE CHECK
Date Time Interrogation Session: 20190224194020
MDC IDC PG IMPLANT DT: 20161003

## 2017-10-30 NOTE — Progress Notes (Signed)
Carelink Summary Report / Loop Recorder 

## 2017-11-23 DIAGNOSIS — I6789 Other cerebrovascular disease: Secondary | ICD-10-CM | POA: Diagnosis not present

## 2017-11-23 DIAGNOSIS — Z6835 Body mass index (BMI) 35.0-35.9, adult: Secondary | ICD-10-CM | POA: Diagnosis not present

## 2017-11-23 DIAGNOSIS — I10 Essential (primary) hypertension: Secondary | ICD-10-CM | POA: Diagnosis not present

## 2017-11-29 ENCOUNTER — Ambulatory Visit (INDEPENDENT_AMBULATORY_CARE_PROVIDER_SITE_OTHER): Payer: Medicare HMO | Admitting: *Deleted

## 2017-11-29 DIAGNOSIS — I639 Cerebral infarction, unspecified: Secondary | ICD-10-CM | POA: Diagnosis not present

## 2017-11-30 NOTE — Progress Notes (Signed)
Carelink Summary Report / Loop Recorder 

## 2017-12-01 LAB — CUP PACEART REMOTE DEVICE CHECK
MDC IDC PG IMPLANT DT: 20161003
MDC IDC SESS DTM: 20190329200954

## 2017-12-19 DIAGNOSIS — I10 Essential (primary) hypertension: Secondary | ICD-10-CM | POA: Diagnosis not present

## 2017-12-19 DIAGNOSIS — R69 Illness, unspecified: Secondary | ICD-10-CM | POA: Diagnosis not present

## 2017-12-19 DIAGNOSIS — K219 Gastro-esophageal reflux disease without esophagitis: Secondary | ICD-10-CM | POA: Diagnosis not present

## 2017-12-19 DIAGNOSIS — E7800 Pure hypercholesterolemia, unspecified: Secondary | ICD-10-CM | POA: Diagnosis not present

## 2017-12-19 DIAGNOSIS — I6789 Other cerebrovascular disease: Secondary | ICD-10-CM | POA: Diagnosis not present

## 2017-12-19 DIAGNOSIS — N401 Enlarged prostate with lower urinary tract symptoms: Secondary | ICD-10-CM | POA: Diagnosis not present

## 2017-12-19 DIAGNOSIS — D692 Other nonthrombocytopenic purpura: Secondary | ICD-10-CM | POA: Diagnosis not present

## 2017-12-19 DIAGNOSIS — G4709 Other insomnia: Secondary | ICD-10-CM | POA: Diagnosis not present

## 2017-12-19 DIAGNOSIS — I6350 Cerebral infarction due to unspecified occlusion or stenosis of unspecified cerebral artery: Secondary | ICD-10-CM | POA: Diagnosis not present

## 2017-12-19 DIAGNOSIS — R0609 Other forms of dyspnea: Secondary | ICD-10-CM | POA: Diagnosis not present

## 2017-12-25 LAB — CUP PACEART REMOTE DEVICE CHECK
Implantable Pulse Generator Implant Date: 20161003
MDC IDC SESS DTM: 20190501213929

## 2018-01-01 ENCOUNTER — Ambulatory Visit (INDEPENDENT_AMBULATORY_CARE_PROVIDER_SITE_OTHER): Payer: Medicare HMO | Admitting: *Deleted

## 2018-01-01 DIAGNOSIS — I639 Cerebral infarction, unspecified: Secondary | ICD-10-CM

## 2018-01-02 NOTE — Progress Notes (Signed)
Carelink Summary Report / Loop Recorder 

## 2018-01-15 ENCOUNTER — Encounter: Payer: Self-pay | Admitting: Cardiology

## 2018-01-15 NOTE — Progress Notes (Signed)
Cardiology Office Note   Date:  01/16/2018   ID:  Julian Echevaria., DOB 1946-06-09, MRN [id number]  PCP:  Haywood Pao, MD  Cardiologist:   No primary care provider on file. Referring:  Tisovec, Fransico Him, MD  Chief Complaint  Patient presents with  . Shortness of Breath      History of Present Illness: Julian Quinto. is a 72 y.o. male who is referred by Tisovec, Fransico Him, MD for evaluation of increased dypsnea.  The patient had a perfusion study about 10 years ago.  Is not clear why.  He is had a right bundle branch block.   In 2016 he was diagnosed with posterior circulation stroke with MRI showing acute nonhemorrhagic infarct involving portions of the right occipital lobe and right thalamus.  A TTE was done and showed no evidence of embolic source and an EF of 55%.   TEE demonstrated an EF of 50 - 55%.  There was moderate plaque in the descending Ao.  There was no cardiac source of embolism.   LINQ demonstrated no atrial fib.  I reviewed the TEE and LINQ reports for this appt.       He presents now with shortness of breath.  He says this is really been going on since he had a stroke.  And probably even before as he was too short of breath he said to do a treadmill test 10 years ago.  He is had an inhaler but has not had no formal lung diagnosis.  He is having shortness of breath.  He says this will happen when he climbs a flight of stairs may be carrying a package.  He can keep going up the stairs.  Is not having any resting shortness of breath, PND or orthopnea.  Is not having any palpitations, presyncope or syncope.  He has had no weight gain or edema.    He denies any chest pressure, neck or arm discomfort.  Is not describing palpitations, presyncope or syncope.  He has had no weight gain or edema.     Past Medical History:  Diagnosis Date  . Arthritis    KNEES   . Cerebrovascular accident involving posterior circulation ([place]) 04/02/2015  . Essential hypertension   . GERD  05/19/2008   Qualifier: Diagnosis of  By: [name] MD, [name]   . History of right bundle branch block (RBBB)   . HLD (hyperlipidemia)   . INGUINAL HERNIA, LEFT 05/29/2008   Qualifier: Diagnosis of  By: [name] MD, [name]   . Neuropathy   . Skin cancer    hx of  . Syncope and collapse     Past Surgical History:  Procedure Laterality Date  . APPENDECTOMY    . CATARACT EXTRACTION    . COLONOSCOPY    . EP IMPLANTABLE DEVICE N/A 05/04/2015   Procedure: Loop Recorder Insertion;  Surgeon: Will Meredith Leeds, MD;  Location: [place] [facility];  Service: Cardiovascular;  Laterality: N/A;  . HERNIA REPAIR    . KNEE SURGERY Right 12/2014  . SKIN CANCER EXCISION  12/2014  . TEE WITHOUT CARDIOVERSION N/A 05/04/2015   Procedure: TRANSESOPHAGEAL ECHOCARDIOGRAM (TEE);  Surgeon: Dorothy Spark, MD;  Location: [facility] END[facility]COPY;  Service: Cardiovascular;  Laterality: N/A;     Current Outpatient Medications  Medication Sig Dispense Refill  . ALPRAZolam (XANAX) 0.5 MG tablet Take 1 tablet (0.5 mg total) by mouth 3 (three) times daily as needed for anxiety. 30 tablet 0  .  atorvastatin (LIPITOR) 80 MG tablet Take 80 mg by mouth daily.    . butalbital-acetaminophen-caffeine (FIORICET, ESGIC) 50-325-40 MG per tablet Take 1 tablet by mouth every 8 (eight) hours as needed for headache. 45 tablet 0  . cetirizine (ZYRTEC) 10 MG tablet Take 10 mg by mouth daily.    . clopidogrel (PLAVIX) 75 MG tablet Take 75 mg by mouth daily.    . famotidine (PEPCID) 20 MG tablet Take 1 tablet (20 mg total) by mouth 2 (two) times daily. 60 tablet 0  . hydrocortisone (ANUSOL-HC) 2.5 % rectal cream Place 1 application rectally daily as needed for hemorrhoids or itching.    . irbesartan (AVAPRO) 300 MG tablet Take 300 mg by mouth daily. Take 1/2 of pill per patient    . loratadine (CLARITIN) 10 MG tablet Take 10 mg by mouth daily.    . pantoprazole (PROTONIX) 40 MG tablet     . tamsulosin (FLOMAX) 0.4 MG CAPS capsule Take 0.4 mg by  mouth.     No current facility-administered medications for this visit.     Allergies:   Hydrocodone    ROS:  Please see the history of present illness.   Otherwise, review of systems are positive for none.   All other systems are reviewed and negative.    PHYSICAL EXAM: VS:  BP 132/78   Pulse 75   Ht 6\' 2"  (1.88 m)   Wt 228 lb (103.4 kg)   BMI 29.27 kg/m  , BMI Body mass index is 29.27 kg/m. GENERAL:  Well appearing HEENT:  Pupils equal round and reactive, fundi not visualized, oral mucosa unremarkable NECK:  No jugular venous distention, waveform within normal limits, carotid upstroke brisk and symmetric, no bruits, no thyromegaly LYMPHATICS:  No cervical, inguinal adenopathy LUNGS:  Clear to auscultation bilaterally BACK:  No CVA tenderness CHEST:  Unremarkable HEART:  PMI not displaced or sustained,S1 and S2 within normal limits, no S3, no S4, no clicks, no rubs, no murmurs ABD:  Flat, positive bowel sounds normal in frequency in pitch, no bruits, no rebound, no guarding, no midline pulsatile mass, no hepatomegaly, no splenomegaly EXT:  2 plus pulses throughout, no edema, no cyanosis no clubbing SKIN:  No rashes no nodules NEURO:  Cranial nerves II through XII grossly intact, motor grossly intact throughout PSYCH:  Cognitively intact, oriented to person place and time    EKG:  EKG is ordered today. The ekg ordered today demonstrates sinus rhythm, rate 75, right bundle branch block, no acute ST-T wave changes.   Recent Labs: No results found for requested labs within last 8760 hours.    Lipid Panel    Component Value Date/Time   CHOL 183 04/03/2015 0421   TRIG 123 04/03/2015 0421   HDL 34 (L) 04/03/2015 0421   CHOLHDL 5.4 04/03/2015 0421   VLDL 25 04/03/2015 0421   LDLCALC 124 (H) 04/03/2015 0421      Wt Readings from Last 3 Encounters:  01/16/18 228 lb (103.4 kg)  08/22/16 230 lb (104.3 kg)  02/09/16 232 lb 9.6 oz (105.5 kg)      Other studies  Reviewed: Additional studies/ records that were reviewed today include: Previous TEE, echo and hospital report and LINQ reports. Review of the above records demonstrates:  Please see elsewhere in the note.     ASSESSMENT AND PLAN:  DOE: He did have a mildly reduced ejection fraction in the past on TEE.  He did drop his oxygen saturation to 90% when he walked  around the office.  I am going to check a BNP level.  I also like him to get a stress echocardiogram.  I like him to try to walk on the treadmill and get the echo images.  Further evaluation will be based on this.  If this work-up is unremarkable I would suggest pulmonary testing per Tisovec, Fransico Him, MD   HTN:  The blood pressure is at target. No change in medications is indicated. We will continue with therapeutic lifestyle changes (TLC).  CVA: He would like to have the loop removed and I went this message to EP.  It is likely approaching end of battery life.   Current medicines are reviewed at length with the patient today.  The patient does not have concerns regarding medicines.  The following changes have been made:  no change  Labs/ tests ordered today include:   Orders Placed This Encounter  Procedures  . B Nat Peptide  . EKG 12-Lead  . ECHOCARDIOGRAM STRESS TEST     Disposition:   FU with me based on the results of the above.     Signed, Minus Breeding, MD  01/16/2018 10:10 AM    [place]

## 2018-01-16 ENCOUNTER — Ambulatory Visit: Payer: Medicare HMO | Admitting: Cardiology

## 2018-01-16 ENCOUNTER — Encounter: Payer: Self-pay | Admitting: Cardiology

## 2018-01-16 VITALS — BP 132/78 | HR 75 | Ht 74.0 in | Wt 228.0 lb

## 2018-01-16 DIAGNOSIS — R0602 Shortness of breath: Secondary | ICD-10-CM | POA: Diagnosis not present

## 2018-01-16 DIAGNOSIS — I10 Essential (primary) hypertension: Secondary | ICD-10-CM

## 2018-01-16 NOTE — Patient Instructions (Signed)
Medication Instructions:  Continue current medications  If you need a refill on your cardiac medications before your next appointment, please call your pharmacy.  Labwork: BNP Today HERE IN OUR OFFICE AT [facility]  Take the provided lab slips with you to the lab for your blood draw.   You will NOT need to fast   Testing/Procedures: Your physician has requested that you have a stress echocardiogram. For further information please visit [email or web address]. Please follow instruction sheet as given.  Follow-Up: Your physician wants you to follow-up in: As Needed.      Thank you for choosing [facility] at [facility]!!

## 2018-01-17 LAB — BRAIN NATRIURETIC PEPTIDE: BNP: 79.2 pg/mL (ref 0.0–100.0)

## 2018-01-23 DIAGNOSIS — Z85828 Personal history of other malignant neoplasm of skin: Secondary | ICD-10-CM | POA: Diagnosis not present

## 2018-01-23 DIAGNOSIS — L718 Other rosacea: Secondary | ICD-10-CM | POA: Diagnosis not present

## 2018-01-23 DIAGNOSIS — L918 Other hypertrophic disorders of the skin: Secondary | ICD-10-CM | POA: Diagnosis not present

## 2018-01-23 DIAGNOSIS — L570 Actinic keratosis: Secondary | ICD-10-CM | POA: Diagnosis not present

## 2018-01-24 ENCOUNTER — Telehealth (HOSPITAL_COMMUNITY): Payer: Self-pay | Admitting: *Deleted

## 2018-01-24 NOTE — Telephone Encounter (Signed)
Spoke to patient and reviewed instructions for upcoming stress echo.  [name]

## 2018-01-25 ENCOUNTER — Telehealth: Payer: Self-pay | Admitting: *Deleted

## 2018-01-25 NOTE — Telephone Encounter (Signed)
-----   Message from Will Meredith Leeds, MD sent at 01/16/2018  9:43 AM EDT -----  Can we get him in for LINQ removal. Thanks. ----- Message ----- From: Minus Breeding, MD Sent: 01/16/2018   9:17 AM To: Constance Haw, MD, Patsey Berthold, NP  He wants to have his LINQ removed.  Sounds reasonable.

## 2018-01-25 NOTE — Telephone Encounter (Signed)
Pt has OV on 7/3 w/ [place] to review. Pt scheduled for 7/31 for LINQ explant. Patient verbalized understanding and agreeable to plan.

## 2018-01-29 ENCOUNTER — Other Ambulatory Visit (HOSPITAL_COMMUNITY): Payer: Medicare HMO

## 2018-01-29 ENCOUNTER — Telehealth: Payer: Self-pay | Admitting: Cardiology

## 2018-01-29 NOTE — Telephone Encounter (Signed)
New Message     Patient is calling to cancel appointment for 07/03. He has a procedure scheduled on 02/28/2018 with Dr. Curt Bears. He is not sure if that appointment needs to be changed or if he can come in the same week or day he has his stress echo on 07/10. Please call.

## 2018-01-29 NOTE — Telephone Encounter (Signed)
Informed pt I would discuss w/ Dr. Curt King whether appt needed prior to explant.  He understands I will call him back this week.

## 2018-01-31 ENCOUNTER — Encounter: Payer: Medicare HMO | Admitting: Cardiology

## 2018-01-31 ENCOUNTER — Telehealth (HOSPITAL_COMMUNITY): Payer: Self-pay | Admitting: *Deleted

## 2018-01-31 NOTE — Telephone Encounter (Signed)
Patient given detailed instructions per Stress Test Requisition Sheet for test on 02/07/18 at 7:30.Patient Notified to arrive 30 minutes early, and that it is imperative to arrive on time for appointment to keep from having the test rescheduled.  Patient verbalized understanding. [name]

## 2018-01-31 NOTE — Telephone Encounter (Signed)
Informed patient that he does not need to see Dr. Curt Bears in the office prior to [facility] explant. Pt asked me to inform Dr. Curt Bears that he will need stiches when closing him up d/t last time he had to go to ED next day and they couldn't stop the bleeding and stiches applied. Informed that I would pass information along to physician. Wound check scheduled for 8/13. Patient verbalized understanding and agreeable to plan.

## 2018-02-05 ENCOUNTER — Ambulatory Visit (INDEPENDENT_AMBULATORY_CARE_PROVIDER_SITE_OTHER): Payer: Medicare HMO | Admitting: *Deleted

## 2018-02-05 DIAGNOSIS — I639 Cerebral infarction, unspecified: Secondary | ICD-10-CM | POA: Diagnosis not present

## 2018-02-06 NOTE — Progress Notes (Signed)
Carelink Summary Report / Loop Recorder 

## 2018-02-07 ENCOUNTER — Ambulatory Visit (HOSPITAL_COMMUNITY): Payer: Medicare HMO | Attending: Cardiology

## 2018-02-07 ENCOUNTER — Ambulatory Visit (HOSPITAL_COMMUNITY): Payer: Medicare HMO

## 2018-02-07 DIAGNOSIS — R0602 Shortness of breath: Secondary | ICD-10-CM

## 2018-02-07 DIAGNOSIS — I4510 Unspecified right bundle-branch block: Secondary | ICD-10-CM | POA: Diagnosis not present

## 2018-02-07 DIAGNOSIS — E785 Hyperlipidemia, unspecified: Secondary | ICD-10-CM | POA: Insufficient documentation

## 2018-02-07 DIAGNOSIS — I10 Essential (primary) hypertension: Secondary | ICD-10-CM | POA: Insufficient documentation

## 2018-02-07 DIAGNOSIS — Z8673 Personal history of transient ischemic attack (TIA), and cerebral infarction without residual deficits: Secondary | ICD-10-CM | POA: Diagnosis not present

## 2018-02-07 LAB — CUP PACEART REMOTE DEVICE CHECK
MDC IDC PG IMPLANT DT: 20161003
MDC IDC SESS DTM: 20190603234003

## 2018-02-08 ENCOUNTER — Telehealth: Payer: Self-pay | Admitting: *Deleted

## 2018-02-08 DIAGNOSIS — R0602 Shortness of breath: Secondary | ICD-10-CM

## 2018-02-08 NOTE — Telephone Encounter (Signed)
Pt aware of his Stress Echo, PFT ordered and send to scheduler to schedule appt

## 2018-02-08 NOTE — Telephone Encounter (Signed)
-----   Message from Minus Breeding, MD sent at 02/08/2018 10:49 AM EDT ----- This was a negative test but there were slightly suboptimal images.  I would suggest PFTs as the next step.  We could order these with [facility].  Call Mr. Pursell with the results and send results to Tisovec, Fransico Him, MD

## 2018-02-28 ENCOUNTER — Ambulatory Visit (HOSPITAL_COMMUNITY)
Admission: RE | Admit: 2018-02-28 | Discharge: 2018-02-28 | Disposition: A | Discharge status: Home / Self Care | Payer: Medicare HMO | Source: Ambulatory Visit | Attending: Cardiology | Admitting: Cardiology

## 2018-02-28 ENCOUNTER — Encounter (HOSPITAL_COMMUNITY): Payer: Self-pay | Admitting: Cardiology

## 2018-02-28 ENCOUNTER — Encounter (HOSPITAL_COMMUNITY)
Admission: RE | Disposition: A | Discharge status: Home / Self Care | Payer: Self-pay | Source: Ambulatory Visit | Attending: Cardiology

## 2018-02-28 DIAGNOSIS — Z885 Allergy status to narcotic agent status: Secondary | ICD-10-CM | POA: Diagnosis not present

## 2018-02-28 DIAGNOSIS — I6389 Other cerebral infarction: Secondary | ICD-10-CM | POA: Diagnosis not present

## 2018-02-28 DIAGNOSIS — I4510 Unspecified right bundle-branch block: Secondary | ICD-10-CM | POA: Diagnosis not present

## 2018-02-28 DIAGNOSIS — G629 Polyneuropathy, unspecified: Secondary | ICD-10-CM | POA: Insufficient documentation

## 2018-02-28 DIAGNOSIS — I10 Essential (primary) hypertension: Secondary | ICD-10-CM | POA: Diagnosis not present

## 2018-02-28 DIAGNOSIS — Z8673 Personal history of transient ischemic attack (TIA), and cerebral infarction without residual deficits: Secondary | ICD-10-CM | POA: Insufficient documentation

## 2018-02-28 DIAGNOSIS — R0602 Shortness of breath: Secondary | ICD-10-CM

## 2018-02-28 DIAGNOSIS — M170 Bilateral primary osteoarthritis of knee: Secondary | ICD-10-CM | POA: Insufficient documentation

## 2018-02-28 DIAGNOSIS — E785 Hyperlipidemia, unspecified: Secondary | ICD-10-CM | POA: Insufficient documentation

## 2018-02-28 DIAGNOSIS — K219 Gastro-esophageal reflux disease without esophagitis: Secondary | ICD-10-CM | POA: Insufficient documentation

## 2018-02-28 DIAGNOSIS — Z4509 Encounter for adjustment and management of other cardiac device: Secondary | ICD-10-CM | POA: Diagnosis present

## 2018-02-28 HISTORY — PX: LOOP RECORDER REMOVAL: EP1215

## 2018-02-28 LAB — PULMONARY FUNCTION TEST
DL/VA % pred: 85 %
DL/VA: 4.09 ml/min/mmHg/L
DLCO unc % pred: 48 %
DLCO unc: 18.45 ml/min/mmHg
FEF 25-75 Post: 2.18 L/sec
FEF 25-75 Pre: 2.17 L/sec
FEF2575-%Change-Post: 0 %
FEF2575-%PRED-PRE: 78 %
FEF2575-%Pred-Post: 79 %
FEV1-%Change-Post: 0 %
FEV1-%PRED-POST: 69 %
FEV1-%PRED-PRE: 69 %
FEV1-Post: 2.56 L
FEV1-Pre: 2.56 L
FEV1FVC-%Change-Post: 4 %
FEV1FVC-%Pred-Pre: 106 %
FEV6-%CHANGE-POST: -4 %
FEV6-%PRED-PRE: 69 %
FEV6-%Pred-Post: 65 %
FEV6-POST: 3.14 L
FEV6-PRE: 3.3 L
FEV6FVC-%PRED-PRE: 105 %
FEV6FVC-%Pred-Post: 105 %
FVC-%Change-Post: -4 %
FVC-%Pred-Post: 62 %
FVC-%Pred-Pre: 65 %
FVC-POST: 3.14 L
FVC-Pre: 3.3 L
POST FEV6/FVC RATIO: 100 %
Post FEV1/FVC ratio: 81 %
Pre FEV1/FVC ratio: 78 %
Pre FEV6/FVC Ratio: 100 %
RV % pred: 58 %
RV: 1.6 L
TLC % PRED: 60 %
TLC: 4.76 L

## 2018-02-28 SURGERY — LOOP RECORDER REMOVAL

## 2018-02-28 MED ORDER — ALBUTEROL SULFATE (2.5 MG/3ML) 0.083% IN NEBU
2.5000 mg | INHALATION_SOLUTION | Freq: Once | RESPIRATORY_TRACT | Status: AC
  Administered 2018-02-28: 2.5 mg via RESPIRATORY_TRACT

## 2018-02-28 MED ORDER — LIDOCAINE-EPINEPHRINE 1 %-1:100000 IJ SOLN
INTRAMUSCULAR | Status: AC
  Filled 2018-02-28: qty 1

## 2018-02-28 MED ORDER — LIDOCAINE-EPINEPHRINE 1 %-1:100000 IJ SOLN
INTRAMUSCULAR | Status: DC | PRN
  Administered 2018-02-28: 20 mL

## 2018-02-28 SURGICAL SUPPLY — 1 items: PACK LOOP INSERTION (CUSTOM PROCEDURE TRAY) ×2 IMPLANT

## 2018-02-28 NOTE — H&P (Signed)
Cardiology Office Note   Date:  02/28/2018   ID:  [name]., DOB Aug 21, 1945, MRN [id number]  PCP:  Haywood Pao, MD  Cardiologist:   No primary care provider on file. Referring:  Tisovec, Fransico Him, MD  No chief complaint on file.     History of Present Illness: [name]. is a 72 y.o. male who is referred by Tisovec, Fransico Him, MD for evaluation of increased dypsnea.  The patient has a history of CVA.  He has had a Linq monitor in place and now the battery is reaching ERI.  The patient would like the Linq monitor removed.  We [name] plan for a Linq monitor removal today.  Today, denies symptoms of palpitations, chest pain, shortness of breath, orthopnea, PND, lower extremity edema, claudication, dizziness, presyncope, syncope, bleeding, or neurologic sequela. The patient is tolerating medications without difficulties.     Past Medical History:  Diagnosis Date  . Arthritis    KNEES   . Cerebrovascular accident involving posterior circulation ([name]) 04/02/2015  . Essential hypertension   . GERD 05/19/2008   Qualifier: Diagnosis of  By: [name] MD, [name]   . History of right bundle branch block (RBBB)   . HLD (hyperlipidemia)   . INGUINAL HERNIA, LEFT 05/29/2008   Qualifier: Diagnosis of  By: [name] MD, [name]   . Neuropathy   . Skin cancer    hx of  . Syncope and collapse     Past Surgical History:  Procedure Laterality Date  . APPENDECTOMY    . CATARACT EXTRACTION    . COLONOSCOPY    . EP IMPLANTABLE DEVICE N/A 05/04/2015   Procedure: Loop Recorder Insertion;  Surgeon: Catlynn Grondahl Meredith Leeds, MD;  Location: [place] [facility];  Service: Cardiovascular;  Laterality: N/A;  . HERNIA REPAIR    . KNEE SURGERY Right 12/2014  . SKIN CANCER EXCISION  12/2014  . TEE WITHOUT CARDIOVERSION N/A 05/04/2015   Procedure: TRANSESOPHAGEAL ECHOCARDIOGRAM (TEE);  Surgeon: Dorothy Spark, MD;  Location: [place];  Service: Cardiovascular;  Laterality: N/A;     No  current facility-administered medications for this encounter.     Allergies:   Hydrocodone    ROS:  Please see the history of present illness.   Otherwise, review of systems is positive for none.   All other systems are reviewed and negative.   PHYSICAL EXAM: VS:  BP (!) 154/90   Pulse 70   Temp 97.9 F (36.6 C) (Oral)   Resp 18   Ht 6\' 2"  (1.88 m)   Wt 225 lb (102.1 kg)   SpO2 99%   BMI 28.89 kg/m  , BMI Body mass index is 28.89 kg/m. GEN: Well nourished, well developed, in no acute distress  HEENT: normal  Neck: no JVD, carotid bruits, or masses Cardiac: RRR; no murmurs, rubs, or gallops,no edema  Respiratory:  clear to auscultation bilaterally, normal work of breathing GI: soft, nontender, nondistended, + BS MS: no deformity or atrophy  Skin: warm and dry Neuro:  Strength and sensation are intact Psych: euthymic mood, full affect  Recent Labs: 01/16/2018: BNP 79.2    Lipid Panel    Component Value Date/Time   CHOL 183 04/03/2015 0421   TRIG 123 04/03/2015 0421   HDL 34 (L) 04/03/2015 0421   CHOLHDL 5.4 04/03/2015 0421   VLDL 25 04/03/2015 0421   LDLCALC 124 (H) 04/03/2015 0421      Wt Readings from Last 3 Encounters:  01/16/18 228 lb (103.4 kg)  08/22/16 230 lb (104.3 kg)  02/09/16 232 lb 9.6 oz (105.5 kg)      ASSESSMENT AND PLAN:  1.  Cryptogenic stroke: Patient has a Linq monitor in place and the battery is at [facility].  We [name] plan for a Linq monitor removal.  Risks and benefits were discussed and include bleeding and infection.  Patient understands these risks and is agreed to the procedure.  Current medicines are reviewed at length with the patient today.  The patient does not have concerns regarding medicines.  The following changes have been made:  no change  Labs/ tests ordered today include:       Signed, Tarini Carrier Meredith Leeds, MD  02/28/2018 11:59 AM    [place]

## 2018-03-01 ENCOUNTER — Other Ambulatory Visit: Payer: Self-pay

## 2018-03-01 DIAGNOSIS — R0602 Shortness of breath: Secondary | ICD-10-CM

## 2018-03-01 DIAGNOSIS — R942 Abnormal results of pulmonary function studies: Secondary | ICD-10-CM

## 2018-03-01 NOTE — Progress Notes (Signed)
Notes recorded by Minus Breeding, MD on 02/28/2018 at 2:20 PM EDT Pulmonary Function Diagnosis: Severe Restriction -Interstitial Moderately severe Diffusion Defect Abnormal results He needs to be referred to for a pulmonary appt.

## 2018-03-08 ENCOUNTER — Ambulatory Visit (INDEPENDENT_AMBULATORY_CARE_PROVIDER_SITE_OTHER): Payer: Medicare HMO | Admitting: *Deleted

## 2018-03-08 DIAGNOSIS — I639 Cerebral infarction, unspecified: Secondary | ICD-10-CM

## 2018-03-09 NOTE — Progress Notes (Signed)
Carelink Summary Report / Loop Recorder 

## 2018-03-10 LAB — CUP PACEART REMOTE DEVICE CHECK
Implantable Pulse Generator Implant Date: 20161003
MDC IDC SESS DTM: 20190706233737

## 2018-03-13 ENCOUNTER — Encounter: Payer: Self-pay | Admitting: Pulmonary Disease

## 2018-03-13 ENCOUNTER — Ambulatory Visit (INDEPENDENT_AMBULATORY_CARE_PROVIDER_SITE_OTHER): Payer: Medicare HMO | Admitting: *Deleted

## 2018-03-13 ENCOUNTER — Ambulatory Visit: Payer: Medicare HMO | Admitting: Pulmonary Disease

## 2018-03-13 DIAGNOSIS — R0602 Shortness of breath: Secondary | ICD-10-CM | POA: Diagnosis not present

## 2018-03-13 DIAGNOSIS — J984 Other disorders of lung: Secondary | ICD-10-CM

## 2018-03-13 DIAGNOSIS — I639 Cerebral infarction, unspecified: Secondary | ICD-10-CM

## 2018-03-13 NOTE — Progress Notes (Signed)
Subjective:    Patient ID: [name]., male    DOB: [birth year], 72 y.o.   MRN: [id number]  HPI  Chief Complaint  Patient presents with  . Pulm Consult    Referred by [facility] for abnormal PFT and SOB. Per patient, he notices the SOB more during exertion. Per wife, patient does wheeze.    40 year old remote smoker referred by cardiology for evaluation of abnormal PFTs.  He reports dyspnea on exertion ongoing for a few months but also admits to a sedentary lifestyle.  He gets very short of breath climbing stairs.  He is able to walk longer distances on level ground.  In fact he went to [facility] and was able to walk at a slow pace.  On a treadmill stress echo he was only able to last 3 minutes and reached a peak heart rate of 134. He reports occasional wheezing.  His wife has asthma.  He has used an albuterol MDI that he was given 3 years ago when he had a pneumonia with significant subjective improvement. He denies frequent chest colds. He smoked 3 packs a day until he quit in his early 3s, less than 15 pack years. He had an episode of pneumonia in 2018  He had a cryptogenic CVA in 2016 that left him with some visual and memory deficits.  He had a  loop recorder placed and this did not pick up any arrhythmias.  He recently had this remote   Significant tests/ events reviewed  Ambulatory saturation 03/2018 -saturation dropped from 96% baseline to 94% with heart rate increasing from 80 to 100  PFT 01/2018 ratio 78, FEV1 of 69%, FVC of 65% TLC 60%, DLCO 48% but corrects to 85% for alveolar volume, consistent with intraparenchymal restriction  CT chest 2009 stable nodules, no evidence of ILD    Past Medical History:  Diagnosis Date  . Arthritis    KNEES   . Cerebrovascular accident involving posterior circulation ([place]) 04/02/2015  . Essential hypertension   . GERD 05/19/2008   Qualifier: Diagnosis of  By: [name] MD, [name]   . History of right bundle branch block (RBBB)   .  HLD (hyperlipidemia)   . INGUINAL HERNIA, LEFT 05/29/2008   Qualifier: Diagnosis of  By: [name] MD, [name]   . Neuropathy   . Skin cancer    hx of  . Syncope and collapse    Past Surgical History:  Procedure Laterality Date  . APPENDECTOMY    . CATARACT EXTRACTION    . COLONOSCOPY    . EP IMPLANTABLE DEVICE N/A 05/04/2015   Procedure: Loop Recorder Insertion;  Surgeon: Will Meredith Leeds, MD;  Location: [place] [facility];  Service: Cardiovascular;  Laterality: N/A;  . HERNIA REPAIR    . KNEE SURGERY Right 12/2014  . LOOP RECORDER REMOVAL N/A 02/28/2018   Procedure: LOOP RECORDER REMOVAL;  Surgeon: Constance Haw, MD;  Location: [place] [facility];  Service: Cardiovascular;  Laterality: N/A;  . SKIN CANCER EXCISION  12/2014  . TEE WITHOUT CARDIOVERSION N/A 05/04/2015   Procedure: TRANSESOPHAGEAL ECHOCARDIOGRAM (TEE);  Surgeon: Dorothy Spark, MD;  Location: [facility] ENDOSCOPY;  Service: Cardiovascular;  Laterality: N/A;    Allergies  Allergen Reactions  . Hydrocodone Itching    Social History   Socioeconomic History  . Marital status: Married    Spouse name: Not on file  . Number of children: Not on file  . Years of education: Not on file  .  Highest education level: Not on file  Occupational History  . Not on file  Social Needs  . Financial resource strain: Not on file  . Food insecurity:    Worry: Not on file    Inability: Not on file  . Transportation needs:    Medical: Not on file    Non-medical: Not on file  Tobacco Use  . Smoking status: Former Smoker    Last attempt to quit: 08/01/1986    Years since quitting: 31.6  . Smokeless tobacco: Never Used  Substance and Sexual Activity  . Alcohol use: Yes    Alcohol/week: 3.0 standard drinks    Types: 1 Glasses of wine, 1 Cans of beer, 1 Shots of liquor per week    Comment: occasional  . Drug use: No  . Sexual activity: Not on file  Lifestyle  . Physical activity:    Days per week: Not on file    Minutes per  session: Not on file  . Stress: Not on file  Relationships  . Social connections:    Talks on phone: Not on file    Gets together: Not on file    Attends religious service: Not on file    Active member of club or organization: Not on file    Attends meetings of clubs or organizations: Not on file    Relationship status: Not on file  . Intimate partner violence:    Fear of current or ex partner: Not on file    Emotionally abused: Not on file    Physically abused: Not on file    Forced sexual activity: Not on file  Other Topics Concern  . Not on file  Social History Narrative  . Not on file      Family History  Problem Relation Age of Onset  . Diabetes Mother   . Kidney disease Mother   . Emphysema Father   . Prostate cancer Maternal Uncle   . CAD Neg Hx        NEGATIVE HX OF PREMATURE CAD     Review of Systems Positive for shortness of breath with activity, acid heartburn, nonproductive cough  Constitutional: negative for anorexia, fevers and sweats  Eyes: negative for irritation, redness and visual disturbance  Ears, nose, mouth, throat, and face: negative for earaches, epistaxis, nasal congestion and sore throat  Respiratory: negative for  sputum and wheezing  Cardiovascular: negative for chest pain,  lower extremity edema, orthopnea, palpitations and syncope  Gastrointestinal: negative for abdominal pain, constipation, diarrhea, melena, nausea and vomiting  Genitourinary:negative for dysuria, frequency and hematuria  Hematologic/lymphatic: negative for bleeding, easy bruising and lymphadenopathy  Musculoskeletal:negative for arthralgias, muscle weakness and stiff joints  Neurological: negative for coordination problems, gait problems, headaches and weakness  Endocrine: negative for diabetic symptoms including polydipsia, polyuria and weight loss     Objective:   Physical Exam   Gen. Pleasant, well-nourished,tall, in no distress, normal affect ENT - no lesions,  no post nasal drip Neck: No JVD, no thyromegaly, no carotid bruits Lungs: no use of accessory muscles, no dullness to percussion, clear without rales or rhonchi  Cardiovascular: Rhythm regular, heart sounds  normal, no murmurs or gallops, no peripheral edema Abdomen: soft and non-tender, no hepatosplenomegaly, BS normal. Musculoskeletal: No deformities, no cyanosis or clubbing Neuro:  alert, non focal        Assessment & Plan:

## 2018-03-13 NOTE — Progress Notes (Signed)
Wound check in clinic s/p [employer] ILR LINQ explant 02/28/18 by Dr. Curt Bears. Dressing removed prior to appointment by the patient. Incision edges approximated, wound well healed without redness, swelling or drainage. Mr. Skilling to call the [facility] if he has not received his monitor return kit by 03/26/18. Follow up with Dr. Curt Bears as needed.

## 2018-03-13 NOTE — Assessment & Plan Note (Signed)
Cause for intraparenchymal restriction is not clear at this time-he does not seem to have congestive heart failure or overt pulmonary fibrosis.  Some scarring was noted on his old chest x-ray-this appears to be more platelike atelectasis in the right lower lobe. We will obtain high-resolution CT chest to clarify  We will also obtain ambulatory saturation to establish baseline and follow-up in 68months   There does not seem to be any evidence of airway obstruction but he felt subjectively improved with albuterol and hence will prescribe an albuterol MDI for use as needed

## 2018-03-13 NOTE — Addendum Note (Signed)
Addended by: Valerie Salts on: 03/13/2018 10:56 AM   Modules accepted: Orders

## 2018-03-13 NOTE — Patient Instructions (Signed)
Ambulatory saturation to establish baseline. Schedule high-resolution CT chest to look for cause of abnormal lung function  Prescription for albuterol MDI 2 puffs every 6 hours as needed

## 2018-03-22 ENCOUNTER — Ambulatory Visit (INDEPENDENT_AMBULATORY_CARE_PROVIDER_SITE_OTHER)
Admission: RE | Admit: 2018-03-22 | Discharge: 2018-03-22 | Disposition: A | Discharge status: Home / Self Care | Payer: Medicare HMO | Source: Ambulatory Visit | Attending: Pulmonary Disease | Admitting: Pulmonary Disease

## 2018-03-22 DIAGNOSIS — R0602 Shortness of breath: Secondary | ICD-10-CM

## 2018-03-23 ENCOUNTER — Telehealth: Payer: Self-pay | Admitting: *Deleted

## 2018-03-23 NOTE — Telephone Encounter (Signed)
Left several message on pt voicemail with no call back.

## 2018-03-23 NOTE — Telephone Encounter (Signed)
-----   Message from Minus Breeding, MD sent at 03/06/2018  9:35 AM EDT ----- Needs to see me in the clinic .  His PFTs are denied until he has further documentation in the chart and I can do that when I see him.

## 2018-04-04 ENCOUNTER — Telehealth: Payer: Self-pay | Admitting: *Deleted

## 2018-04-04 NOTE — Telephone Encounter (Signed)
-----   Message from Minus Breeding, MD sent at 03/06/2018  9:35 AM EDT ----- Needs to see me in the clinic .  His PFTs are denied until he has further documentation in the chart and I can do that when I see him.

## 2018-04-04 NOTE — Telephone Encounter (Signed)
Pt is being followed by Dr Elsworth Soho pulmonologist

## 2018-04-16 ENCOUNTER — Other Ambulatory Visit: Payer: Self-pay | Admitting: Pulmonary Disease

## 2018-04-16 MED ORDER — ALBUTEROL SULFATE HFA 108 (90 BASE) MCG/ACT IN AERS: 2.0000 | INHALATION_SPRAY | Freq: Four times a day (QID) | RESPIRATORY_TRACT | 6 refills | Status: DC | PRN

## 2018-04-18 LAB — CUP PACEART REMOTE DEVICE CHECK
Date Time Interrogation Session: 20190809000958
MDC IDC PG IMPLANT DT: 20161003

## 2018-04-23 ENCOUNTER — Ambulatory Visit (HOSPITAL_COMMUNITY)
Admission: RE | Admit: 2018-04-23 | Discharge: 2018-04-23 | Disposition: A | Discharge status: Home / Self Care | Payer: Medicare HMO | Source: Ambulatory Visit | Attending: Pulmonary Disease | Admitting: Pulmonary Disease

## 2018-04-23 ENCOUNTER — Encounter (HOSPITAL_COMMUNITY)
Admission: RE | Disposition: A | Discharge status: Home / Self Care | Payer: Self-pay | Source: Ambulatory Visit | Attending: Pulmonary Disease

## 2018-04-23 ENCOUNTER — Ambulatory Visit (HOSPITAL_COMMUNITY): Payer: Medicare HMO

## 2018-04-23 ENCOUNTER — Encounter (HOSPITAL_COMMUNITY): Payer: Self-pay | Admitting: Respiratory Therapy

## 2018-04-23 DIAGNOSIS — Z85828 Personal history of other malignant neoplasm of skin: Secondary | ICD-10-CM | POA: Diagnosis not present

## 2018-04-23 DIAGNOSIS — J8410 Pulmonary fibrosis, unspecified: Secondary | ICD-10-CM | POA: Diagnosis not present

## 2018-04-23 DIAGNOSIS — R590 Localized enlarged lymph nodes: Secondary | ICD-10-CM | POA: Insufficient documentation

## 2018-04-23 DIAGNOSIS — Z87891 Personal history of nicotine dependence: Secondary | ICD-10-CM | POA: Insufficient documentation

## 2018-04-23 DIAGNOSIS — K219 Gastro-esophageal reflux disease without esophagitis: Secondary | ICD-10-CM | POA: Insufficient documentation

## 2018-04-23 DIAGNOSIS — R05 Cough: Secondary | ICD-10-CM | POA: Diagnosis not present

## 2018-04-23 DIAGNOSIS — I69311 Memory deficit following cerebral infarction: Secondary | ICD-10-CM | POA: Insufficient documentation

## 2018-04-23 DIAGNOSIS — J984 Other disorders of lung: Secondary | ICD-10-CM | POA: Diagnosis not present

## 2018-04-23 DIAGNOSIS — I10 Essential (primary) hypertension: Secondary | ICD-10-CM | POA: Diagnosis not present

## 2018-04-23 DIAGNOSIS — R918 Other nonspecific abnormal finding of lung field: Secondary | ICD-10-CM

## 2018-04-23 DIAGNOSIS — G629 Polyneuropathy, unspecified: Secondary | ICD-10-CM | POA: Diagnosis not present

## 2018-04-23 DIAGNOSIS — I69312 Visuospatial deficit and spatial neglect following cerebral infarction: Secondary | ICD-10-CM | POA: Insufficient documentation

## 2018-04-23 DIAGNOSIS — R911 Solitary pulmonary nodule: Secondary | ICD-10-CM | POA: Diagnosis not present

## 2018-04-23 DIAGNOSIS — I4510 Unspecified right bundle-branch block: Secondary | ICD-10-CM | POA: Insufficient documentation

## 2018-04-23 DIAGNOSIS — E785 Hyperlipidemia, unspecified: Secondary | ICD-10-CM | POA: Insufficient documentation

## 2018-04-23 DIAGNOSIS — M170 Bilateral primary osteoarthritis of knee: Secondary | ICD-10-CM | POA: Insufficient documentation

## 2018-04-23 DIAGNOSIS — Z9889 Other specified postprocedural states: Secondary | ICD-10-CM

## 2018-04-23 DIAGNOSIS — Z885 Allergy status to narcotic agent status: Secondary | ICD-10-CM | POA: Insufficient documentation

## 2018-04-23 HISTORY — PX: VIDEO BRONCHOSCOPY: SHX5072

## 2018-04-23 SURGERY — BRONCHOSCOPY, WITH FLUOROSCOPY
Anesthesia: Moderate Sedation | Laterality: Bilateral

## 2018-04-23 MED ORDER — LIDOCAINE HCL (PF) 1 % IJ SOLN
INTRAMUSCULAR | Status: DC | PRN
  Administered 2018-04-23: 6 mL

## 2018-04-23 MED ORDER — PHENYLEPHRINE HCL 0.25 % NA SOLN
NASAL | Status: DC | PRN
  Administered 2018-04-23: 2 via NASAL

## 2018-04-23 MED ORDER — FENTANYL CITRATE (PF) 100 MCG/2ML IJ SOLN
INTRAMUSCULAR | Status: AC
  Filled 2018-04-23: qty 4

## 2018-04-23 MED ORDER — SODIUM CHLORIDE 0.9 % IV SOLN
Freq: Once | INTRAVENOUS | Status: AC
  Administered 2018-04-23: 10:00:00 via INTRAVENOUS

## 2018-04-23 MED ORDER — MIDAZOLAM HCL 5 MG/ML IJ SOLN
INTRAMUSCULAR | Status: AC
  Filled 2018-04-23: qty 2

## 2018-04-23 MED ORDER — FENTANYL CITRATE (PF) 100 MCG/2ML IJ SOLN
25.0000 ug | INTRAMUSCULAR | Status: DC | PRN
  Administered 2018-04-23 (×2): 50 ug via INTRAVENOUS
  Administered 2018-04-23 (×2): 25 ug via INTRAVENOUS

## 2018-04-23 MED ORDER — LIDOCAINE HCL URETHRAL/MUCOSAL 2 % EX GEL
CUTANEOUS | Status: DC | PRN
  Administered 2018-04-23: 1

## 2018-04-23 MED ORDER — MIDAZOLAM HCL 5 MG/ML IJ SOLN: INTRAMUSCULAR | Status: DC | PRN

## 2018-04-23 MED ORDER — MIDAZOLAM HCL 10 MG/2ML IJ SOLN
INTRAMUSCULAR | Status: DC | PRN
  Administered 2018-04-23 (×3): 1 mg via INTRAVENOUS

## 2018-04-23 NOTE — H&P (Signed)
72 year old remote smoker referred by cardiology for evaluation of abnormal PFTs.  He reports dyspnea on exertion ongoing for a few months but also admits to a sedentary lifestyle.  He gets very short of breath climbing stairs.  He is able to walk longer distances on level ground.  In fact he went to [facility] and was able to walk at a slow pace.  On a treadmill stress echo he was only able to last 3 minutes and reached a peak heart rate of 134. He reports occasional wheezing.  His wife has asthma.  He has used an albuterol MDI that he was given 3 years ago when he had a pneumonia with significant subjective improvement. He denies frequent chest colds. He smoked 3 packs a day until he quit in his early 23s, less than 15 pack years. He had an episode of pneumonia in 2018  He had a cryptogenic CVA in 2016 that left him with some visual and memory deficits.  He had a  loop recorder placed and this did not pick up any arrhythmias.  He recently had this remote   Significant tests/ events reviewed  Ambulatory saturation 03/2018 -saturation dropped from 96% baseline to 94% with heart rate increasing from 80 to 100  PFT 01/2018 ratio 78, FEV1 of 69%, FVC of 65% TLC 60%, DLCO 48% but corrects to 85% for alveolar volume, consistent with intraparenchymal restriction  CT chest 2009 stable nodules, no evidence of ILD        Past Medical History:  Diagnosis Date  . Arthritis    KNEES   . Cerebrovascular accident involving posterior circulation ([place]) 04/02/2015  . Essential hypertension   . GERD 05/19/2008   Qualifier: Diagnosis of  By: [name] MD, [name]   . History of right bundle branch block (RBBB)   . HLD (hyperlipidemia)   . INGUINAL HERNIA, LEFT 05/29/2008   Qualifier: Diagnosis of  By: [name] MD, [name]   . Neuropathy   . Skin cancer    hx of  . Syncope and collapse         Past Surgical History:  Procedure Laterality Date  . APPENDECTOMY    . CATARACT  EXTRACTION    . COLONOSCOPY    . EP IMPLANTABLE DEVICE N/A 05/04/2015   Procedure: Loop Recorder Insertion;  Surgeon: Will Meredith Leeds, MD;  Location: [facility];  Service: Cardiovascular;  Laterality: N/A;  . HERNIA REPAIR    . KNEE SURGERY Right 12/2014  . LOOP RECORDER REMOVAL N/A 02/28/2018   Procedure: LOOP RECORDER REMOVAL;  Surgeon: Constance Haw, MD;  Location: [facility];  Service: Cardiovascular;  Laterality: N/A;  . SKIN CANCER EXCISION  12/2014  . TEE WITHOUT CARDIOVERSION N/A 05/04/2015   Procedure: TRANSESOPHAGEAL ECHOCARDIOGRAM (TEE);  Surgeon: Dorothy Spark, MD;  Location: [facility] ENDOSCOPY;  Service: Cardiovascular;  Laterality: N/A;        Allergies  Allergen Reactions  . Hydrocodone Itching    Social History        Socioeconomic History  . Marital status: Married    Spouse name: Not on file  . Number of children: Not on file  . Years of education: Not on file  . Highest education level: Not on file  Occupational History  . Not on file  Social Needs  . Financial resource strain: Not on file  . Food insecurity:    Worry: Not on file    Inability: Not on file  .  Transportation needs:    Medical: Not on file    Non-medical: Not on file  Tobacco Use  . Smoking status: Former Smoker    Last attempt to quit: 08/01/1986    Years since quitting: 31.6  . Smokeless tobacco: Never Used  Substance and Sexual Activity  . Alcohol use: Yes    Alcohol/week: 3.0 standard drinks    Types: 1 Glasses of wine, 1 Cans of beer, 1 Shots of liquor per week    Comment: occasional  . Drug use: No  . Sexual activity: Not on file  Lifestyle  . Physical activity:    Days per week: Not on file    Minutes per session: Not on file  . Stress: Not on file  Relationships  . Social connections:    Talks on phone: Not on file    Gets together: Not on file    Attends religious service: Not on file    Active member  of club or organization: Not on file    Attends meetings of clubs or organizations: Not on file    Relationship status: Not on file  . Intimate partner violence:    Fear of current or ex partner: Not on file    Emotionally abused: Not on file    Physically abused: Not on file    Forced sexual activity: Not on file  Other Topics Concern  . Not on file  Social History Narrative  . Not on file           Family History  Problem Relation Age of Onset  . Diabetes Mother   . Kidney disease Mother   . Emphysema Father   . Prostate cancer Maternal Uncle   . CAD Neg Hx        NEGATIVE HX OF PREMATURE CAD     Review of Systems Positive for shortness of breath with activity, acid heartburn, nonproductive cough  Constitutional: negative for anorexia, fevers and sweats  Eyes: negative for irritation, redness and visual disturbance  Ears, nose, mouth, throat, and face: negative for earaches, epistaxis, nasal congestion and sore throat  Respiratory: negative for  sputum and wheezing  Cardiovascular: negative for chest pain,  lower extremity edema, orthopnea, palpitations and syncope  Gastrointestinal: negative for abdominal pain, constipation, diarrhea, melena, nausea and vomiting  Genitourinary:negative for dysuria, frequency and hematuria  Hematologic/lymphatic: negative for bleeding, easy bruising and lymphadenopathy  Musculoskeletal:negative for arthralgias, muscle weakness and stiff joints  Neurological: negative for coordination problems, gait problems, headaches and weakness  Endocrine: negative for diabetic symptoms including polydipsia, polyuria and weight loss     Objective:   Physical Exam   Gen. Pleasant, well-nourished,tall, in no distress, normal affect ENT - no lesions, no post nasal drip Neck: No JVD, no thyromegaly, no carotid bruits Lungs: no use of accessory muscles, no dullness to percussion, clear without rales or rhonchi   Cardiovascular: Rhythm regular, heart sounds  normal, no murmurs or gallops, no peripheral edema Abdomen: soft and non-tender, no hepatosplenomegaly, BS normal. Musculoskeletal: No deformities, no cyanosis or clubbing Neuro:  alert, non focal        Assessment & Plan:      Assessment & Plan Note by Rigoberto Noel, MD at 03/13/2018 10:46 AM  Author: Rigoberto Noel, MD Author Type: Physician Filed: 03/13/2018 10:52 AM  Note Status: Written Cosign: Cosign Not Required Encounter Date: 03/13/2018  Problem: Restrictive lung disease  Editor: Rigoberto Noel, MD (Physician)    Cause for intraparenchymal  restriction is not clear at this time-he does not seem to have congestive heart failure or overt pulmonary fibrosis.  Some scarring was noted on his old chest x-ray-this appears to be more platelike atelectasis in the right lower lobe. We will obtain high-resolution CT chest to clarify  We will also obtain ambulatory saturation to establish baseline and follow-up in 58months   There does not seem to be any evidence of airway obstruction but he felt subjectively improved with albuterol and hence will prescribe an albuterol MDI for use as needed      Discussed CT results with patient and his wife on the phone. Bronchoscopy scheduled for 9/23 at 10:30 AM at [facility]. Discussed risks and benefits of procedure    [name]. has presented today for procedure, with the diagnosis of infilatrate & mediastinal LNs -calcified. The various methods of treatment have been discussed with the patient and family. After consideration of risks, benefits and other options for treatment, the patient has consented to Procedure(s) :  Bronchoscopy with biopsy .  The patient's history has been reviewed, patient examined, no change in status, stable for surgery. I have reviewed the patient's chart and labs. Questions were answered to the patient's satisfaction   Brylyn Novakovich V. Elsworth Soho MD  [phone]

## 2018-04-23 NOTE — Progress Notes (Signed)
Video bronchoscopy performed.  Intervention bronchial biopsy Intervention bronchial brushing. Intervention bronchial washing.  No complications noted.  Will continue to monitor. 

## 2018-04-23 NOTE — Discharge Instructions (Signed)
Flexible Bronchoscopy, Care After These instructions give you information on caring for yourself after your procedure. Your doctor may also give you more specific instructions. Call your doctor if you have any problems or questions after your procedure. Follow these instructions at home:  Do not eat or drink anything for 2 hours after your procedure. If you try to eat or drink before the medicine wears off, food or drink could go into your lungs. You could also burn yourself.  After 2 hours have passed and when you can cough and gag normally, you may eat soft food and drink liquids slowly.  The day after the test, you may eat your normal diet.  You may do your normal activities.  Keep all doctor visits. Get help right away if:  You get more and more short of breath.  You get light-headed.  You feel like you are going to pass out (faint).  You have chest pain.  You have new problems that worry you.  You cough up more than a little blood.  You cough up more blood than before.   Do not eat or drink anything until 1:30 pm on 04/23/2018. This information is not intended to replace advice given to you by your health care provider. Make sure you discuss any questions you have with your health care provider. Document Released: 05/15/2009 Document Revised: 12/24/2015 Document Reviewed: 03/22/2013 Elsevier Interactive Patient Education  2017 [employer].

## 2018-04-23 NOTE — Op Note (Signed)
Indication: RLL unexplained pulmonary infiltrates with calcified mediastinal lymphadenopathy in the 72 year-old remote smoker  Written informed consent was obtained from the patient prior to the procedure. The risks of the procedure including coughing, bleeding and a small chance of lung cancer requiring a chest tube were discussed with the patient in great detail and evidenced understanding.  3 mg of Versed and  150 mcg of fentanyl were used in divided doses during the procedure. Bronchoscope was inserted from the right Nare. The upper airway appeared normal. Vocal cord showed normal appearance in motion. The trachea bronchial tree was then examined to the subsegmental level. Minimal  secretions were noted. Endobronchial lesion was noted blocking the RML bronchus.  Endobronchial brushings x2 & biopsies x4 were taken from this area. BAL also obtained Attention was then turned to the right lower lobe.  Transbronchial biopsies x 1 were obtained from the  right lower lobe. The patient tolerated procedure well with minimal bleeding.  A portable chest X. will be performed to rule out presence of pneumothorax. He was awake and alert in the end of the procedure.  Specimens - BAL for cytology & micro Brushings for cytology Endobronchial bx RML - path TBBx RLL for path   [name] V. Elsworth Soho MD  [phone]

## 2018-04-24 LAB — ACID FAST SMEAR (AFB, MYCOBACTERIA): Acid Fast Smear: NEGATIVE

## 2018-04-25 LAB — CULTURE, BAL-QUANTITATIVE W GRAM STAIN: Culture: NORMAL

## 2018-04-25 LAB — CULTURE, BAL-QUANTITATIVE

## 2018-04-26 ENCOUNTER — Telehealth: Payer: Self-pay | Admitting: Pulmonary Disease

## 2018-04-26 DIAGNOSIS — R911 Solitary pulmonary nodule: Secondary | ICD-10-CM

## 2018-04-26 DIAGNOSIS — R918 Other nonspecific abnormal finding of lung field: Secondary | ICD-10-CM

## 2018-04-26 NOTE — Telephone Encounter (Signed)
Called and spoke to pt.  Pt is requesting bx results from 04/23/18.   RA please advise. Thanks.

## 2018-04-27 MED ORDER — PREDNISONE 10 MG PO TABS: ORAL_TABLET | ORAL | 0 refills | Status: DC

## 2018-04-27 NOTE — Telephone Encounter (Signed)
I have called the patient and he is aware that these result are not in yet.  He did say he has a wet sounding cough but not getting anything up. He would like to know if this is normal or if he should take something  for this.  Dr Elsworth Soho please advise

## 2018-04-27 NOTE — Telephone Encounter (Signed)
Discussed biopsy results with patient and his wife.  I also discussed with pathology. Did not show cancer but did show one area of granuloma.  Endobronchial obstruction not typical for sarcoid so we still have to be vigilant for cancer. We will undertake trial of treatment and then schedule PET scan in  4 weeks Please send in prescription for prednisone -5 mg tablets-start with 20 mg for 10 days, then 15 mg for 10 days, then 10 mg for 10 days, then 5 mg and stay at this dose until seen again. Needs office visit after PET scan  Patient [facility] [address] and [address]

## 2018-04-27 NOTE — Telephone Encounter (Signed)
Spoke with pt. Advised him that we would be setting up his PET scan. Once he knows that date of this scan he will make an appointment with Dr. Elsworth Soho. [facility] [place] [place] [facility] the pt's request. Nothing further was needed at this time.

## 2018-05-03 ENCOUNTER — Ambulatory Visit (HOSPITAL_COMMUNITY): Payer: Medicare HMO

## 2018-05-20 ENCOUNTER — Other Ambulatory Visit: Payer: Self-pay | Admitting: Pulmonary Disease

## 2018-05-24 LAB — FUNGUS CULTURE WITH STAIN: Fungus Stain: UNDETERMINED

## 2018-05-24 LAB — FUNGAL ORGANISM REFLEX

## 2018-05-25 ENCOUNTER — Encounter (HOSPITAL_COMMUNITY)
Admission: RE | Admit: 2018-05-25 | Discharge: 2018-05-25 | Disposition: A | Discharge status: Home / Self Care | Payer: Medicare HMO | Source: Ambulatory Visit | Attending: Pulmonary Disease | Admitting: Pulmonary Disease

## 2018-05-25 DIAGNOSIS — R911 Solitary pulmonary nodule: Secondary | ICD-10-CM | POA: Insufficient documentation

## 2018-05-25 DIAGNOSIS — R918 Other nonspecific abnormal finding of lung field: Secondary | ICD-10-CM | POA: Insufficient documentation

## 2018-05-25 LAB — GLUCOSE, CAPILLARY: Glucose-Capillary: 97 mg/dL (ref 70–99)

## 2018-05-25 MED ORDER — FLUDEOXYGLUCOSE F - 18 (FDG) INJECTION
11.2700 | Freq: Once | INTRAVENOUS | Status: AC | PRN
  Administered 2018-05-25: 11.27 via INTRAVENOUS

## 2018-05-30 ENCOUNTER — Encounter: Payer: Self-pay | Admitting: Adult Health

## 2018-05-30 ENCOUNTER — Ambulatory Visit: Payer: Medicare HMO | Admitting: Adult Health

## 2018-05-30 DIAGNOSIS — R918 Other nonspecific abnormal finding of lung field: Secondary | ICD-10-CM

## 2018-05-30 DIAGNOSIS — D869 Sarcoidosis, unspecified: Secondary | ICD-10-CM

## 2018-05-30 NOTE — Progress Notes (Signed)
_0  ID: Julian Echevaria., [name]    DOB: 1945-08-14, 72 y.o.   MRN: [id number]  Chief Complaint  Patient presents with  . Follow-up    PET scan results     Referring provider: Haywood Pao, MD  HPI: 72 year old [name] former smoker seen for pulmonary consult March 13, 2018 for abnormal PFT and shortness of breath found to have a lung mass Medical history significant for cryptogenic CVA in 2016 with memory and visual deficits  Significant tests/ events reviewed  Ambulatory saturation 03/2018 -saturation dropped from 96% baseline to 94% with heart rate increasing from 80 to 100  PFT 01/2018 ratio 78, FEV1 of 69%, FVC of 65% TLC 60%, DLCO 48% but corrects to 85% for alveolar volume, consistent with intraparenchymal restriction  CT chest 2009 stable nodules, no evidence of ILD  05/30/2018 Follow up : Lung mass Patient presents for a follow-up visit.  Patient was seen for pulmonary consult March 13, 2018 for abnormal PFT showing moderate restriction.  Along with progressive shortness of breath.  Patient was set up for a high-resolution CT chest to rule out underlying interstitial lung disease. CT chest showed calcified and noncalcified mediastinal and upper abdominal adenopathy with progression.  Perilymphatic distribution of nodularity in the lungs right greater than left. Right hilar calcification and masslike soft tissue with near complete obstruction of the right middle lobe bronchus and associated right middle lobe volume loss.  Mild basilar subpleural fibrosis.  Patient underwent a bronchoscopy on April 23, 2018 that showed endobronchial lesion blocking the right middle lobe bronchus.  Biopsy showed pathology and cytology negative for malignant cells.  There was an area of granuloma noted.  Cultures have been negative to date including BAL , fungal and AFB. Patient was started on empiric prednisone for possible underlying sarcoidosis.  20 mg for 10 days, 50 mg for 10 days  10 mg for 10 days and then 5 mg daily.  Unfortunately patient stopped prednisone. PET scan was done on May 25, 2018 after being on prednisone for 4 weeks this showed numerous small hypermetabolic mediastinal and hilar lymph nodes which some of which are calcified.  Right hilar adenopathy and adjacent interstitial lung disease is hypermetabolic.  Peripheral airspace nodule in the right upper lobe hypermetabolic left upper lobe pulmonary nodule mildly hypermetabolic.  Dense airspace consolidation of the right lower lobe has improved along with improved surrounding airspace nodularity. Numerous periportal and retroperitoneal lymph nodes are hypermetabolic.  Skeleton with no focal hypermetabolic activity.  Patient says since starting prednisone his cough is much improved.    Allergies  Allergen Reactions  . Hydrocodone Itching    Immunization History  Administered Date(s) Administered  . Pneumococcal-Unspecified 01/29/2014    Past Medical History:  Diagnosis Date  . Arthritis    KNEES   . Cerebrovascular accident involving posterior circulation ([place]) 04/02/2015  . Essential hypertension   . GERD 05/19/2008   Qualifier: Diagnosis of  By: [name] MD, [name]   . History of right bundle branch block (RBBB)   . HLD (hyperlipidemia)   . INGUINAL HERNIA, LEFT 05/29/2008   Qualifier: Diagnosis of  By: [name] MD, [name]   . Neuropathy   . Skin cancer    hx of  . Syncope and collapse     Tobacco History: Social History   Tobacco Use  Smoking Status Former Smoker  . Packs/day: 4.00  . Years: 24.00  . Pack years: 96.00  . Last attempt to quit: 08/01/1986  .  Years since quitting: 31.8  Smokeless Tobacco Never Used   Counseling given: Not Answered   Outpatient Medications Prior to Visit  Medication Sig Dispense Refill  . albuterol (PROVENTIL HFA;VENTOLIN HFA) 108 (90 Base) MCG/ACT inhaler Inhale 2 puffs into the lungs every 6 (six) hours as needed for wheezing or shortness of breath. 1  Inhaler 6  . atorvastatin (LIPITOR) 80 MG tablet Take 40 mg by mouth at bedtime.     . cetirizine (ZYRTEC) 10 MG tablet Take 10 mg by mouth daily.    . clopidogrel (PLAVIX) 75 MG tablet Take 75 mg by mouth daily.    . famotidine (PEPCID) 20 MG tablet Take 20 mg by mouth daily.    Marland Kitchen losartan (COZAAR) 100 MG tablet Take 100 mg by mouth daily.  2  . pantoprazole (PROTONIX) 40 MG tablet Take 40 mg by mouth daily.     . tamsulosin (FLOMAX) 0.4 MG CAPS capsule Take 0.4 mg by mouth at bedtime.     . predniSONE (DELTASONE) 10 MG tablet 20 mg for 10 days, 15 mg for 10 days, 10 mg for 10 days, 5 mg and stay at this dose until seen again. (Patient not taking: Reported on 05/30/2018) 90 tablet 0  . albuterol (PROVENTIL HFA;VENTOLIN HFA) 108 (90 Base) MCG/ACT inhaler Inhale 1 puff into the lungs every 6 (six) hours as needed for wheezing or shortness of breath.    . famotidine (PEPCID) 20 MG tablet Take 1 tablet (20 mg total) by mouth 2 (two) times daily. (Patient taking differently: Take 20 mg by mouth daily. ) 60 tablet 0   No facility-administered medications prior to visit.      Review of Systems  Constitutional:   No  weight loss, night sweats,  Fevers, chills, fatigue, or  lassitude.  HEENT:   No headaches,  Difficulty swallowing,  Tooth/dental problems, or  Sore throat,                No sneezing, itching, ear ache, nasal congestion, post nasal drip,   CV:  No chest pain,  Orthopnea, PND, swelling in lower extremities, anasarca, dizziness, palpitations, syncope.   GI  No heartburn, indigestion, abdominal pain, nausea, vomiting, diarrhea, change in bowel habits, loss of appetite, bloody stools.   Resp: No shortness of breath with exertion or at rest.  No excess mucus, no productive cough,  No non-productive cough,  No coughing up of blood.  No change in color of mucus.  No wheezing.  No chest wall deformity  Skin: no rash or lesions.  GU: no dysuria, change in color of urine, no urgency or  frequency.  No flank pain, no hematuria   MS:  No joint pain or swelling.  No decreased range of motion.  No back pain.    Physical Exam    GEN: A/Ox3; pleasant , NAD, well nourished    HEENT:  [place]/AT,  EACs-clear, TMs-wnl, NOSE-clear, THROAT-clear, no lesions, no postnasal drip or exudate noted.   NECK:  Supple w/ fair ROM; no JVD; normal carotid impulses w/o bruits; no thyromegaly or nodules palpated; no lymphadenopathy.    RESP  Clear  P & A; w/o, wheezes/ rales/ or rhonchi. no accessory muscle use, no dullness to percussion  CARD:  RRR, no m/r/g, no peripheral edema, pulses intact, no cyanosis or clubbing.  GI:   Soft & nt; nml bowel sounds; no organomegaly or masses detected.   Musco: Warm bil, no deformities or joint swelling noted.   Neuro:  alert, no focal deficits noted.    Skin: Warm, no lesions or rashes    Lab Results:  CBC    Component Value Date/Time   WBC 9.8 04/27/2015 1002   RBC 4.89 04/27/2015 1002   HGB 15.1 04/27/2015 1002   HCT 45.7 04/27/2015 1002   PLT 221.0 04/27/2015 1002   MCV 93.3 04/27/2015 1002   MCH 31.4 04/06/2015 1331   MCHC 33.1 04/27/2015 1002   RDW 13.2 04/27/2015 1002   LYMPHSABS 2.0 04/27/2015 1002   MONOABS 1.2 (H) 04/27/2015 1002   EOSABS 0.4 04/27/2015 1002   BASOSABS 0.0 04/27/2015 1002    BMET    Component Value Date/Time   NA 139 04/27/2015 1002   K 4.4 04/27/2015 1002   CL 103 04/27/2015 1002   CO2 28 04/27/2015 1002   GLUCOSE 87 04/27/2015 1002   BUN 34 (H) 04/27/2015 1002   CREATININE 1.50 04/27/2015 1002   CALCIUM 9.4 04/27/2015 1002   GFRNONAA 46 (L) 04/06/2015 1331   GFRAA 53 (L) 04/06/2015 1331    BNP    Component Value Date/Time   BNP 79.2 01/16/2018 1008    ProBNP No results found for: PROBNP  Imaging: Nm Pet Image Initial (pi) Skull Base To Thigh  Result Date: 05/25/2018 CLINICAL DATA:  Initial treatment strategy for pulmonary lesions. EXAM: NUCLEAR MEDICINE PET SKULL BASE TO THIGH  TECHNIQUE: 11.27 mCi F-18 FDG was injected intravenously. Full-ring PET imaging was performed from the skull base to thigh after the radiotracer. CT data was obtained and used for attenuation correction and anatomic localization. Fasting blood glucose: 97 mg/dl COMPARISON:  Chest CT 03/22/2018 FINDINGS: Mediastinal blood pool activity: SUV max 2.71 NECK: No hypermetabolic lymph nodes in the neck. Incidental CT findings: none CHEST: 7 mm left supraclavicular node is hypermetabolic with SUV max of 3.01. Numerous small hypermetabolic mediastinal and hilar lymph nodes some of which are calcified. The right hilar adenopathy and adjacent interstitial lung disease is hypermetabolic with SUV max of 3.14. Left hilar lymph node on image number 82 measures 18 mm and SUV max is 7.71. 7 mm internal mammary lymph node on the left on image 78 is hypermetabolic with SUV max of 3.88. Peripheral airspace nodule in the right upper lobe on image number 15 is hypermetabolic with SUV max of 8.75. Left upper lobe pulmonary nodule on image number 15 is mildly hypermetabolic with SUV max of 7.97. Incidental CT findings: The dense airspace consolidation in the right lower lobe anteriorly has improved. The surrounding airspace nodularity is also improved. ABDOMEN/PELVIS: Numerous periportal and retroperitoneal lymph nodes are hypermetabolic. Left retroperitoneal lymph node on image number 126 measures 12 mm and SUV max is 15.74. Cluster of lymph nodes anterior to the IVC on image number 114 has an SUV max of 8.91. Lymph nodes in the splenic hilum are hypermetabolic with SUV max of 2.82. No solid abdominal organ lesions.  The adrenal glands are normal. Incidental CT findings: Infrarenal abdominal aortic aneurysm just above the iliac artery bifurcation measures 3.2 x 3.2 cm. Surgical changes from prior left inguinal hernia repair. SKELETON: No focal hypermetabolic activity to suggest bone lesions. Incidental CT findings: none IMPRESSION: 1.  Constellation of findings are most typical for pulmonary sarcoidosis with extensive mediastinal and hilar adenopathy and interstitial and alveolar lung disease. Hypermetabolism is likely due to active granulomas. 2. Hypermetabolic abdominal lymph nodes also likely sarcoidosis. 3. Improved right lower lobe lung aeration and decrease in alveolar nodularity Electronically Signed   By: Mamie Nick.  Gallerani M.D.   On: 05/25/2018 17:11      PFT Results Latest Ref Rng & Units 02/28/2018  FVC-Pre L 3.30  FVC-Predicted Pre % 65  FVC-Post L 3.14  FVC-Predicted Post % 62  Pre FEV1/FVC % % 78  Post FEV1/FCV % % 81  FEV1-Pre L 2.56  FEV1-Predicted Pre % 69  DLCO UNC% % 48  DLCO COR %Predicted % 85  TLC L 4.76  TLC % Predicted % 60  RV % Predicted % 58    No results found for: NITRICOXIDE      Assessment & Plan:   No problem-specific Assessment & Plan notes found for this encounter.     Rexene Edison, NP 05/30/2018

## 2018-05-30 NOTE — Patient Instructions (Addendum)
Restart Prednisone 20mg  daily for 2 weeks then 10mg  daily . Hold at this dose.  Low sweet diet  Activity as tolerated.  CT chest in 5 weeks . - check labs day before scan  Follow up with Dr. Elsworth Soho  Or Marquette Piontek NP after scan .  Please contact office for sooner follow up if symptoms do not improve or worsen or seek emergency care

## 2018-06-01 ENCOUNTER — Other Ambulatory Visit: Payer: Self-pay

## 2018-06-01 DIAGNOSIS — D869 Sarcoidosis, unspecified: Secondary | ICD-10-CM | POA: Insufficient documentation

## 2018-06-01 MED ORDER — PREDNISONE 10 MG PO TABS: ORAL_TABLET | ORAL | 0 refills | Status: DC

## 2018-06-01 NOTE — Assessment & Plan Note (Signed)
Probable Sarcoid with granuloma noted on FOB cytology . Cx neg to date . Neg malignant cells on path.  PET shows hypermetabolic area in lungs/nodes .  Decreased nodularity /consolidation on steroids c/w sarcoid  Will continue on steroids for now  Monitor closely and repeat CT in 6 weeks , if not improving will need to consider repeat biopsy .   Plan  Patient Instructions  Restart Prednisone 20mg  daily for 2 weeks then 10mg  daily . Hold at this dose.  Low sweet diet  Activity as tolerated.  CT chest in 5 weeks . - check labs day before scan  Follow up with Dr. Elsworth Soho  Or [place] NP after scan .  Please contact office for sooner follow up if symptoms do not improve or worsen or seek emergency care

## 2018-06-01 NOTE — Assessment & Plan Note (Signed)
Decreased nodularity on PET scan on steroids however areas are , hypermetabolic Enlarged lymph nodes and nodularity along with granuloma on cytology from FOB c/w sarcoid however endobronchial lesion noted (not typical of sarcoid )  Will need to follow closely for response to steroids.   Plan  Patient Instructions  Restart Prednisone 20mg  daily for 2 weeks then 10mg  daily . Hold at this dose.  Low sweet diet  Activity as tolerated.  CT chest in 5 weeks . - check labs day before scan  Follow up with Dr. Elsworth Soho  Or Josafat Enrico NP after scan .  Please contact office for sooner follow up if symptoms do not improve or worsen or seek emergency care    n

## 2018-06-06 LAB — ACID FAST CULTURE WITH REFLEXED SENSITIVITIES: ACID FAST CULTURE - AFSCU3: NEGATIVE

## 2018-06-25 DIAGNOSIS — R82998 Other abnormal findings in urine: Secondary | ICD-10-CM | POA: Diagnosis not present

## 2018-06-25 DIAGNOSIS — K219 Gastro-esophageal reflux disease without esophagitis: Secondary | ICD-10-CM | POA: Diagnosis not present

## 2018-06-25 DIAGNOSIS — I10 Essential (primary) hypertension: Secondary | ICD-10-CM | POA: Diagnosis not present

## 2018-06-25 DIAGNOSIS — J3089 Other allergic rhinitis: Secondary | ICD-10-CM | POA: Diagnosis not present

## 2018-06-25 DIAGNOSIS — Z0000 Encounter for general adult medical examination without abnormal findings: Secondary | ICD-10-CM | POA: Diagnosis not present

## 2018-06-25 DIAGNOSIS — E7800 Pure hypercholesterolemia, unspecified: Secondary | ICD-10-CM | POA: Diagnosis not present

## 2018-06-25 DIAGNOSIS — Z23 Encounter for immunization: Secondary | ICD-10-CM | POA: Diagnosis not present

## 2018-06-25 DIAGNOSIS — I6789 Other cerebrovascular disease: Secondary | ICD-10-CM | POA: Diagnosis not present

## 2018-06-25 DIAGNOSIS — G4709 Other insomnia: Secondary | ICD-10-CM | POA: Diagnosis not present

## 2018-06-25 DIAGNOSIS — N401 Enlarged prostate with lower urinary tract symptoms: Secondary | ICD-10-CM | POA: Diagnosis not present

## 2018-06-25 DIAGNOSIS — Z125 Encounter for screening for malignant neoplasm of prostate: Secondary | ICD-10-CM | POA: Diagnosis not present

## 2018-06-25 DIAGNOSIS — M19041 Primary osteoarthritis, right hand: Secondary | ICD-10-CM | POA: Diagnosis not present

## 2018-06-29 ENCOUNTER — Other Ambulatory Visit: Payer: Self-pay | Admitting: Pulmonary Disease

## 2018-06-29 ENCOUNTER — Telehealth: Payer: Self-pay | Admitting: Pulmonary Disease

## 2018-06-29 DIAGNOSIS — L299 Pruritus, unspecified: Secondary | ICD-10-CM

## 2018-06-29 MED ORDER — HYDROXYZINE PAMOATE 25 MG PO CAPS: 25.0000 mg | ORAL_CAPSULE | Freq: Four times a day (QID) | ORAL | 0 refills | Status: AC | PRN

## 2018-06-29 NOTE — Telephone Encounter (Signed)
Patient called in transferring service with complaints of a skin rash  He feels he is reacting to prednisone This is the only new medication is on  Denies any problems with his breathing Denies any chest pains or chest discomfort  He is only bothersome complaint is itching and a rash  I did advise him to stop prednisone Prescription for Atarax sent in Skin emollient over-the-counter  Call Dr. Elsworth Soho on Monday for follow-up Seek medical help if any shortness of breath or if he notices any significant change in his breathing

## 2018-07-02 ENCOUNTER — Telehealth: Payer: Self-pay | Admitting: Pulmonary Disease

## 2018-07-02 NOTE — Telephone Encounter (Signed)
Please schedule him to be seen with Elsworth Soho or any NP tomorrow. Thanks

## 2018-07-02 NOTE — Progress Notes (Signed)
Pt has been scheduled for an appt at our office with Derl Barrow, NP tomorrow, 07/03/18 at [place].

## 2018-07-02 NOTE — Telephone Encounter (Signed)
Called pt to clarify it it was the prednisone and pt stated that it was the new med he had been prescribed. Pt stated he had been on two pills 2 weeks and then one pill until he called the doctor due to the rash/hives and then once he developed the rash, he did stop the prednisone. Pt was prescribed hydroxyzine but states he does not see any improvement with the rash. Pt stated all it has done is make him sleepy.  Pt stated when the rash developed, he did call PCP and they stated to him to call our office. Pt does have an OV scheduled with RA Tues. 12/10 with a CT prior.   While speaking with pt, pt stated he had some labwork performed at Dr. Loren Racer office a week ago which I am having sent to our office due to pt stating RA was needing to see pt's kidney function prior to the CT to see if anything needed to be done differently with the CT. I called Dr. Loren Racer office to see if they could fax [place] the results and the receptionist's desk's system was down so I was transferred to the referral coordinator. Left a message with the coordinator stating that we were needing the lab results sent to our office for provider to view.

## 2018-07-02 NOTE — Telephone Encounter (Signed)
Did he stop prednisone? Continue hydroxyzine. Is he sure this is prednisone? He has been on it for awhile. Strange that it would just now start to cause problems. He needs an OV with his PCP for the rash and then follow up with his doc here to determine best plan for treatment of sarcoid.

## 2018-07-02 NOTE — Telephone Encounter (Signed)
Called and spoke with patient , he stated that he was given prednisone and it caused him to break out into a rash. The rash was on both sides from his armpit down to his thighs, across his back and belly. Red raised hives that itch. He was then given a prescription of hydroxyzine to help with this. This has helped some but he cannot tell a lot of difference. He has used benadryl to help with this.   SG please advise on what patient can do. Thank you.

## 2018-07-02 NOTE — Progress Notes (Signed)
Agree  With SG -quite unlikely that prednisone will be causing the rash after being on this for 2 months I see that he has an appointment with me on 12/10 and we can discuss Can he see his PCP prior to this?

## 2018-07-02 NOTE — Telephone Encounter (Signed)
Called and spoke with pt letting him know that SG stated for [place] to schedule an OV tomorrow at our office. Pt expressed understanding. OV scheduled for pt with Derl Barrow, NP at [place]. Nothing further needed.

## 2018-07-03 ENCOUNTER — Telehealth: Payer: Self-pay

## 2018-07-03 ENCOUNTER — Encounter: Payer: Self-pay | Admitting: Primary Care

## 2018-07-03 ENCOUNTER — Ambulatory Visit: Payer: Medicare HMO | Admitting: Primary Care

## 2018-07-03 DIAGNOSIS — R918 Other nonspecific abnormal finding of lung field: Secondary | ICD-10-CM

## 2018-07-03 DIAGNOSIS — R21 Rash and other nonspecific skin eruption: Secondary | ICD-10-CM

## 2018-07-03 MED ORDER — CLOTRIMAZOLE-BETAMETHASONE 1-0.05 % EX CREA: 1.0000 "application " | TOPICAL_CREAM | Freq: Two times a day (BID) | CUTANEOUS | 0 refills | Status: AC

## 2018-07-03 NOTE — Patient Instructions (Addendum)
Recommendations: Cool compresses to affected area  Avoid hot showers Try a detergent free laundry cleaner, sensitive skin soaps/lotions, change deodorant  Sarna lotion (local drug store) Re-start prednisone   Rx: Lotrisone cream to affected areas   Referral: Dermatology  Follow-up: Next follow up with Dr. Elsworth Soho on Dec 10th

## 2018-07-03 NOTE — Addendum Note (Signed)
Addended by: Karmen Stabs on: 07/03/2018 10:34 AM   Modules accepted: Orders

## 2018-07-03 NOTE — Assessment & Plan Note (Addendum)
-   Decreased nodularity on PET scan with oral steriods, however, some areas are still hypermetabolic - Probable sarcoid with granuloma on FOB cytology, however endobrachial lesion was noted with is not typical. Negative malignant cells on pathology  - Recommend restarting prednisone, if medication was cause of rash would likely have improved off steroid   - Plan repeat CT CHEST next week to monitor, labs prior  - FU with Dr. Elsworth Soho on 12/10

## 2018-07-03 NOTE — Progress Notes (Signed)
@Patient  ID: [name]., male    DOB: 1946/05/05, 72 y.o.   MRN: [id number]  Chief Complaint  Patient presents with  . Acute Visit    rash with itching from waist up     Referring provider: Haywood Pao, MD  HPI: 72 year old male, former smoker quit in 1988.  PMH significant for abnormal PFTs with restrictive lung disease, GERD, hypertension. Patient of Dr. Elsworth Soho, most recently seen by pulmonary NP on 05/30/18.    Seen for initial pulmonary consult in August 2019.  Patient had a HRCT scan to rule out ILD, CT showed calcified and noncalcified mediastinal and upper abdominal adenopathy with progression. Hilar calcification and masslike soft tissue with near complete obstruction on the right middle lobe bronchus and associated right middle lobe volume loss.  He had a bronchoscopy in September 2019 that showed endobronchial lesion blocking the right middle lobe bronchus.  Biopsy positive for granuloma, pathology and cytology negative for malignant cells.  Patient was started on empiric prednisone for possible underlying sarcoidosis.  PET scan completed in October after being on prednisone for 4 weeks showed numerous small hypermetabolic mediastinal and hilar lymph nodes.  Right hilar adenopathy and adjacent interstitial lung disease is hypermetabolic. Dense airspace consolidation of the right lower lobe has improved along with improved surrounding airspace nodularity.  Numerous peri-portal and retroperitoneal lymph nodes are hypermetabolic.   07/03/2018- Acute OV, Rash Presents today with complaints of a rash that started between November 26 and 28th.  Patient reports pruritic sensation for 1 week prior to eruption.  States that the only new medication that he has been on is prednisone, however, he has been taking oral steroids for 4 weeks prior to this without issue.  Denies any recent travel, yardwork or housework.  No new foods or other medications. Called pulmonary office 4 days ago and  on-call physician recommended to start Atarax.  She states that this is not helped his rash and has only made him sleepy.  Stop taking prednisone 3 to 4 days ago with no improvement in rash.  Denies fever, difficulty breathing or swallowing.    Allergies  Allergen Reactions  . Hydrocodone Itching    Immunization History  Administered Date(s) Administered  . Influenza, High Dose Seasonal PF 05/01/2017  . Pneumococcal-Unspecified 01/29/2014    Past Medical History:  Diagnosis Date  . Arthritis    KNEES   . Cerebrovascular accident involving posterior circulation ([place]) 04/02/2015  . Essential hypertension   . GERD 05/19/2008   Qualifier: Diagnosis of  By: [name] MD, [name]   . History of right bundle branch block (RBBB)   . HLD (hyperlipidemia)   . INGUINAL HERNIA, LEFT 05/29/2008   Qualifier: Diagnosis of  By: [name] MD, [name]   . Neuropathy   . Skin cancer    hx of  . Syncope and collapse     Tobacco History: Social History   Tobacco Use  Smoking Status Former Smoker  . Packs/day: 4.00  . Years: 24.00  . Pack years: 96.00  . Last attempt to quit: 08/01/1986  . Years since quitting: 31.9  Smokeless Tobacco Never Used   Counseling given: Not Answered   Outpatient Medications Prior to Visit  Medication Sig Dispense Refill  . albuterol (PROVENTIL HFA;VENTOLIN HFA) 108 (90 Base) MCG/ACT inhaler Inhale 2 puffs into the lungs every 6 (six) hours as needed for wheezing or shortness of breath. 1 Inhaler 6  . atorvastatin (LIPITOR) 80 MG tablet Take 40 mg  by mouth at bedtime.     . cetirizine (ZYRTEC) 10 MG tablet Take 10 mg by mouth daily.    . clopidogrel (PLAVIX) 75 MG tablet Take 75 mg by mouth daily.    . famotidine (PEPCID) 20 MG tablet Take 20 mg by mouth daily.    . hydrOXYzine (VISTARIL) 25 MG capsule Take 1 capsule (25 mg total) by mouth 4 (four) times daily as needed for up to 14 days for itching. 56 capsule 0  . losartan (COZAAR) 100 MG tablet Take 100 mg by mouth  daily.  2  . pantoprazole (PROTONIX) 40 MG tablet Take 40 mg by mouth daily.     . tamsulosin (FLOMAX) 0.4 MG CAPS capsule Take 0.4 mg by mouth at bedtime.     . predniSONE (DELTASONE) 10 MG tablet Take 2 tablets daily for 2 weeks and then reduce to 1 tablet daily (Patient not taking: Reported on 07/03/2018) 90 tablet 0   No facility-administered medications prior to visit.     Review of Systems  Review of Systems  Respiratory: Negative for cough, shortness of breath and wheezing.   Cardiovascular: Negative.   Skin: Positive for rash.       Skin itching   Psychiatric/Behavioral: Negative.    Physical Exam  BP 132/90 (BP Location: Right Arm, Cuff Size: Normal)   Pulse 73   Temp 98.3 F (36.8 C)   Ht 6' (1.829 m)   Wt 237 lb (107.5 kg)   SpO2 96%   BMI 32.14 kg/m  Physical Exam  Constitutional: He is oriented to person, place, and time. He appears well-developed and well-nourished.  HENT:  Head: Normocephalic and atraumatic.  Eyes: Pupils are equal, round, and reactive to light. EOM are normal.  Neck: Normal range of motion. Neck supple.  Cardiovascular: Normal rate, regular rhythm, normal heart sounds and intact distal pulses.  Pulmonary/Chest: Effort normal and breath sounds normal. No respiratory distress. He has no wheezes.  Abdominal: Soft. Bowel sounds are normal. There is no tenderness.  Neurological: He is alert and oriented to person, place, and time.  Skin: Skin is warm and dry. No rash noted. No erythema.  Diffuse areas of urticaria in bilateral axillary region and lower abdominal fold   Psychiatric: He has a normal mood and affect. His behavior is normal. Judgment normal.     Lab Results:  CBC    Component Value Date/Time   WBC 9.8 04/27/2015 1002   RBC 4.89 04/27/2015 1002   HGB 15.1 04/27/2015 1002   HCT 45.7 04/27/2015 1002   PLT 221.0 04/27/2015 1002   MCV 93.3 04/27/2015 1002   MCH 31.4 04/06/2015 1331   MCHC 33.1 04/27/2015 1002   RDW 13.2  04/27/2015 1002   LYMPHSABS 2.0 04/27/2015 1002   MONOABS 1.2 (H) 04/27/2015 1002   EOSABS 0.4 04/27/2015 1002   BASOSABS 0.0 04/27/2015 1002    BMET    Component Value Date/Time   NA 139 04/27/2015 1002   K 4.4 04/27/2015 1002   CL 103 04/27/2015 1002   CO2 28 04/27/2015 1002   GLUCOSE 87 04/27/2015 1002   BUN 34 (H) 04/27/2015 1002   CREATININE 1.50 04/27/2015 1002   CALCIUM 9.4 04/27/2015 1002   GFRNONAA 46 (L) 04/06/2015 1331   GFRAA 53 (L) 04/06/2015 1331    BNP    Component Value Date/Time   BNP 79.2 01/16/2018 1008    ProBNP No results found for: PROBNP  Imaging: No results found.   Assessment &  Plan:   Rash and nonspecific skin eruption - New diffuse rash to bilateral axillary region and lower abdominal/back fold. Rash has some hive like features. No pustules or drainge - Unlikely from prednisone, patient has been on medication for 4 weeks prior to rash erupting - Possibly from detergent or clothing, also could be fungal related to moisture/sweat - Recommend cool compresses, changing detergents and soaps - Try Sarna lotion for itch, continue zyrtec and prn benadryl  - RX Lotrisone cream BID  - Dermatology referral  - If rash worsens when he restart prednisone instructed patient to call us   Mass of middle lobe of right lung - Decreased nodularity on PET scan with oral steriods, however, some areas are still hypermetabolic - Probable sarcoid with granuloma on FOB cytology, however endobrachial lesion was noted with is not typical. Negative malignant cells on pathology  - Recommend restarting prednisone, if medication was cause of rash would likely have improved off steroid   - Plan repeat CT CHEST next week to monitor, labs prior  - FU with Dr. Elsworth Soho on 12/10     Martyn Ehrich, NP 07/03/2018

## 2018-07-03 NOTE — Assessment & Plan Note (Addendum)
-   New diffuse rash to bilateral axillary region and lower abdominal/back fold. Rash has some hive like features. No pustules or drainge - Unlikely from prednisone, patient has been on medication for 4 weeks prior to rash erupting - Possibly from detergent or clothing, also could be fungal related to moisture/sweat - Recommend cool compresses, changing detergents and soaps - Try Sarna lotion for itch, continue zyrtec and prn benadryl  - RX Lotrisone cream BID  - Dermatology referral  - If rash worsens when he restart prednisone instructed patient to call us

## 2018-07-03 NOTE — Telephone Encounter (Signed)
Referred to derm.[facility] dermatology per pt.Had appt for 12/4 but [place] declined [place] appt on 12/9 or 12/10.No appt available.Derm office will f/u with pt.

## 2018-07-10 ENCOUNTER — Ambulatory Visit: Payer: Medicare HMO | Admitting: Pulmonary Disease

## 2018-07-10 ENCOUNTER — Ambulatory Visit (INDEPENDENT_AMBULATORY_CARE_PROVIDER_SITE_OTHER)
Admission: RE | Admit: 2018-07-10 | Discharge: 2018-07-10 | Disposition: A | Discharge status: Home / Self Care | Payer: Medicare HMO | Source: Ambulatory Visit | Attending: Adult Health | Admitting: Adult Health

## 2018-07-10 ENCOUNTER — Encounter: Payer: Self-pay | Admitting: Pulmonary Disease

## 2018-07-10 VITALS — BP 138/72 | HR 69 | Ht 72.0 in | Wt 238.0 lb

## 2018-07-10 DIAGNOSIS — R918 Other nonspecific abnormal finding of lung field: Secondary | ICD-10-CM | POA: Diagnosis not present

## 2018-07-10 DIAGNOSIS — D869 Sarcoidosis, unspecified: Secondary | ICD-10-CM | POA: Diagnosis not present

## 2018-07-10 DIAGNOSIS — J984 Other disorders of lung: Secondary | ICD-10-CM | POA: Diagnosis not present

## 2018-07-10 DIAGNOSIS — R590 Localized enlarged lymph nodes: Secondary | ICD-10-CM | POA: Diagnosis not present

## 2018-07-10 LAB — BASIC METABOLIC PANEL
BUN: 28 mg/dL — ABNORMAL HIGH (ref 6–23)
CO2: 26 mEq/L (ref 19–32)
Calcium: 8.9 mg/dL (ref 8.4–10.5)
Chloride: 105 mEq/L (ref 96–112)
Creatinine, Ser: 1.38 mg/dL (ref 0.40–1.50)
GFR: 53.75 mL/min — ABNORMAL LOW (ref >60.00)
Glucose, Bld: 93 mg/dL (ref 70–99)
Potassium: 3.8 mEq/L (ref 3.5–5.1)
Sodium: 140 mEq/L (ref 135–145)

## 2018-07-10 MED ORDER — IOPAMIDOL (ISOVUE-300) INJECTION 61%
80.0000 mL | Freq: Once | INTRAVENOUS | Status: AC | PRN
  Administered 2018-07-10: 80 mL via INTRAVENOUS

## 2018-07-10 NOTE — Assessment & Plan Note (Signed)
Resolved on CT, this is not a malignancy. Doubt that repeat bronchoscopy is necessary here

## 2018-07-10 NOTE — Patient Instructions (Addendum)
Stay on 10 mg of prednisone On January 15, decrease to 5 mg of prednisone  Lung capacity appears mildly improved at 68%  Referral to pulmonary rehab program

## 2018-07-10 NOTE — Assessment & Plan Note (Signed)
PET scan shows hypermetabolic mediastinal supraclavicular and abdominal lymphadenopathy with interstitial/alveolar disease Biopsy showing granulomas Doubt we have to consider other lymphoproliferative process. He has had good radiological response to prednisone however lung function does not seem to be significantly improved and subjectively cough is better but he still has some dyspnea. We will continue 10 mg of prednisone for an extended period of time and start taper in mid January very slowly.  I also advised him to get started on pulmonary rehab if he can find a suitable  program in [place]

## 2018-07-10 NOTE — Progress Notes (Signed)
   Subjective:    Patient ID: [name]., male    DOB: 1946/04/25, 72 y.o.   MRN: [id number]  HPI 72 year old male, former smoker quit in 1988, initially seen for restrictive lung disease on PFTs, consolidation on CT with mediastinal calcified lymphadenopathy, biopsy showing granulomas, presumptive diagnosis of sarcoidosis   He was started on 20 mg of prednisone which he took for 2 weeks and then drop to 10 mg of prednisone he has been on this for a few weeks.  He stopped taking this when he developed a rash for about 5 days.  Rash is now resolved he is back on 10 mg of prednisone after his last office visit. His cough is resolved however his dyspnea still persists.  He has gained about 20 pounds on the prednisone   Significant tests/ events reviewed PFTs 01/2018 moderate intraparenchymal restriction, ratio 78, FEV1 69%, FVC 65%, TLC 60%, DLCO 48% but corrects to 85% for alveolar volume  Spirometry 07/2018-FVC 68%  03/2018  HRCT >> calcified and noncalcified mediastinal and upper abdominal adenopathy with progression. Hilar calcification and masslike soft tissue with near complete obstruction on the right middle lobe bronchus and associated right middle lobe volume loss.     04/2018 bronchoscopy in September 2019 that showed endobronchial lesion blocking the right middle lobe bronchus.  Biopsy positive for granuloma, pathology and cytology negative for malignant cells.    PET 05/2018 >> consistent with sarcoid with hypermetabolic mediastinal and hilar adenopathy and interstitial lung/alveolar lung disease and hypermetabolic abdominal lymph nodes  CT chest 07/2018 improved right lower lobe aeration   Past Medical History:  Diagnosis Date  . Arthritis    KNEES   . Cerebrovascular accident involving posterior circulation ([place]) 04/02/2015  . Essential hypertension   . GERD 05/19/2008   Qualifier: Diagnosis of  By: [name] MD, [name]   . History of right bundle branch block (RBBB)   . HLD  (hyperlipidemia)   . INGUINAL HERNIA, LEFT 05/29/2008   Qualifier: Diagnosis of  By: [name] MD, [name]   . Neuropathy   . Skin cancer    hx of  . Syncope and collapse         Review of Systems neg for any significant sore throat, dysphagia, itching, sneezing, nasal congestion or excess/ purulent secretions, fever, chills, sweats, unintended wt loss, pleuritic or exertional cp, hempoptysis, orthopnea pnd or change in chronic leg swelling. Also denies presyncope, palpitations, heartburn, abdominal pain, nausea, vomiting, diarrhea or change in bowel or urinary habits, dysuria,hematuria, rash, arthralgias, visual complaints, headache, numbness weakness or ataxia.     Objective:   Physical Exam   Gen. Pleasant, obese, in no distress ENT - no lesions, no post nasal drip Neck: No JVD, no thyromegaly, no carotid bruits Lungs: no use of accessory muscles, no dullness to percussion, RLL  rales no rhonchi  Cardiovascular: Rhythm regular, heart sounds  normal, no murmurs or gallops, no peripheral edema Musculoskeletal: No deformities, no cyanosis or clubbing , no tremors Skin - no rash , mild erythema over flanks        Assessment & Plan:

## 2018-08-06 DIAGNOSIS — L570 Actinic keratosis: Secondary | ICD-10-CM | POA: Diagnosis not present

## 2018-08-06 DIAGNOSIS — Z85828 Personal history of other malignant neoplasm of skin: Secondary | ICD-10-CM | POA: Diagnosis not present

## 2018-08-16 ENCOUNTER — Other Ambulatory Visit: Payer: Self-pay | Admitting: Pulmonary Disease

## 2018-08-16 ENCOUNTER — Other Ambulatory Visit: Payer: Self-pay

## 2018-08-16 MED ORDER — PREDNISONE 5 MG PO TABS: 5.0000 mg | ORAL_TABLET | Freq: Every day | ORAL | 1 refills | Status: DC

## 2018-09-12 ENCOUNTER — Ambulatory Visit: Payer: Medicare HMO | Admitting: Pulmonary Disease

## 2018-09-12 DIAGNOSIS — M79662 Pain in left lower leg: Secondary | ICD-10-CM | POA: Diagnosis not present

## 2018-09-12 DIAGNOSIS — I679 Cerebrovascular disease, unspecified: Secondary | ICD-10-CM | POA: Diagnosis not present

## 2018-09-12 DIAGNOSIS — Z6831 Body mass index (BMI) 31.0-31.9, adult: Secondary | ICD-10-CM | POA: Diagnosis not present

## 2018-09-12 DIAGNOSIS — I10 Essential (primary) hypertension: Secondary | ICD-10-CM | POA: Diagnosis not present

## 2018-09-13 ENCOUNTER — Ambulatory Visit: Payer: Medicare HMO | Admitting: Pulmonary Disease

## 2018-09-13 ENCOUNTER — Other Ambulatory Visit (HOSPITAL_COMMUNITY): Payer: Self-pay | Admitting: Internal Medicine

## 2018-09-13 ENCOUNTER — Ambulatory Visit (HOSPITAL_COMMUNITY)
Admission: RE | Admit: 2018-09-13 | Discharge: 2018-09-13 | Disposition: A | Discharge status: Home / Self Care | Payer: Medicare HMO | Source: Ambulatory Visit | Attending: Family | Admitting: Family

## 2018-09-13 ENCOUNTER — Ambulatory Visit (INDEPENDENT_AMBULATORY_CARE_PROVIDER_SITE_OTHER)
Admission: RE | Admit: 2018-09-13 | Discharge: 2018-09-13 | Disposition: A | Discharge status: Home / Self Care | Payer: Medicare HMO | Source: Ambulatory Visit | Attending: Pulmonary Disease | Admitting: Pulmonary Disease

## 2018-09-13 ENCOUNTER — Encounter: Payer: Self-pay | Admitting: Pulmonary Disease

## 2018-09-13 VITALS — BP 138/82 | HR 83 | Ht 74.0 in | Wt 239.0 lb

## 2018-09-13 DIAGNOSIS — D869 Sarcoidosis, unspecified: Secondary | ICD-10-CM | POA: Diagnosis not present

## 2018-09-13 DIAGNOSIS — M79662 Pain in left lower leg: Secondary | ICD-10-CM

## 2018-09-13 DIAGNOSIS — J181 Lobar pneumonia, unspecified organism: Secondary | ICD-10-CM | POA: Diagnosis not present

## 2018-09-13 NOTE — Assessment & Plan Note (Signed)
Will attempt to taper off prednisone but concerned that as we do this, disease may relapse  Stay on 5 mg of prednisone On April 1, decrease to 5 mg Monday/Wednesday/Friday  CT chest without contrast prior to next visit in 3 months

## 2018-09-13 NOTE — Assessment & Plan Note (Signed)
Resolved on CT, due to sarcoidosis

## 2018-09-13 NOTE — Patient Instructions (Addendum)
Stay on 5 mg of prednisone On April 1, decrease to 5 mg Monday/Wednesday/Friday  CT chest without contrast prior to next visit in 3 months

## 2018-09-13 NOTE — Progress Notes (Signed)
   Subjective:    Patient ID: Julian Echevaria., male    DOB: [birth year], 73 y.o.   MRN: [id number]  HPI  73 year old male, former smoker quit in 1988, initially seen for restrictive lung disease on PFTs, consolidation on CT with mediastinal calcified lymphadenopathy, biopsy showing granulomas, presumptive diagnosis of sarcoidosis  Chief Complaint  Patient presents with  . Follow-up    pt states breathing been about the same since last visit   Prednisone was started at 20 mg after bronchoscopy in 04/2018 and gradually tapered down to 5 mg in mid January.  He has gained about 10 pounds Breathing is about the same.  Pulmonary rehab at [facility] was too expensive. He brought his own oximeter and started an exercise program but developed calf pain over the past 2 weeks.  Saw his PCP Tisovec.  A venous duplex is planned  Cough is overall much improved but dyspnea is about the same  Chest x-ray obtained today which shows right mid zone infiltrate but overall improved aeration of the right lung   Significant tests/ events reviewed  PFTs 01/2018 moderate intraparenchymal restriction, ratio 78, FEV1 69%, FVC 65%, TLC 60%, DLCO 48% but corrects to 85% for alveolar volume  Spirometry 07/2018-FVC 68%  03/2018  HRCT>> calcified and noncalcified mediastinal and upper abdominal adenopathy with progression. Hilarcalcification and masslike soft tissue with near complete obstruction on the right middle lobe bronchus and associated right middle lobe volume loss.    04/2018 bronchoscopy in September 2019 that showed endobronchial lesion blocking the right middle lobe bronchus. Biopsy positive for granuloma, pathology and cytology negative for malignant cells.   PET 05/2018 >> consistent with sarcoid with hypermetabolic mediastinal and hilar adenopathy and interstitial lung/alveolar lung disease and hypermetabolic abdominal lymph nodes  CT chest 07/2018 improved right lower lobe  aeration  Review of Systems neg for any significant sore throat, dysphagia, itching, sneezing, nasal congestion or excess/ purulent secretions, fever, chills, sweats, unintended wt loss, pleuritic or exertional cp, hempoptysis, orthopnea pnd or change in chronic leg swelling. Also denies presyncope, palpitations, heartburn, abdominal pain, nausea, vomiting, diarrhea or change in bowel or urinary habits, dysuria,hematuria, rash, arthralgias, visual complaints, headache, numbness weakness or ataxia.     Objective:   Physical Exam   Gen. Pleasant, well-nourished, in no distress ENT - no thrush, no pallor/icterus,no post nasal drip Neck: No JVD, no thyromegaly, no carotid bruits Lungs: no use of accessory muscles, no dullness to percussion, clear without rales or rhonchi  Cardiovascular: Rhythm regular, heart sounds  normal, no murmurs or gallops, no peripheral edema Musculoskeletal: No deformities, no cyanosis or clubbing         Assessment & Plan:

## 2018-09-28 DIAGNOSIS — Z6831 Body mass index (BMI) 31.0-31.9, adult: Secondary | ICD-10-CM | POA: Diagnosis not present

## 2018-09-28 DIAGNOSIS — J309 Allergic rhinitis, unspecified: Secondary | ICD-10-CM | POA: Diagnosis not present

## 2018-09-28 DIAGNOSIS — R51 Headache: Secondary | ICD-10-CM | POA: Diagnosis not present

## 2018-09-28 DIAGNOSIS — R066 Hiccough: Secondary | ICD-10-CM | POA: Diagnosis not present

## 2018-09-28 DIAGNOSIS — I6350 Cerebral infarction due to unspecified occlusion or stenosis of unspecified cerebral artery: Secondary | ICD-10-CM | POA: Diagnosis not present

## 2018-09-28 DIAGNOSIS — K219 Gastro-esophageal reflux disease without esophagitis: Secondary | ICD-10-CM | POA: Diagnosis not present

## 2018-10-03 ENCOUNTER — Other Ambulatory Visit: Payer: Self-pay | Admitting: Internal Medicine

## 2018-10-03 DIAGNOSIS — G4452 New daily persistent headache (NDPH): Secondary | ICD-10-CM

## 2018-10-07 ENCOUNTER — Ambulatory Visit
Admission: RE | Admit: 2018-10-07 | Discharge: 2018-10-07 | Disposition: A | Discharge status: Home / Self Care | Payer: Medicare HMO | Source: Ambulatory Visit | Attending: Internal Medicine | Admitting: Internal Medicine

## 2018-10-07 DIAGNOSIS — G4452 New daily persistent headache (NDPH): Secondary | ICD-10-CM

## 2018-10-07 DIAGNOSIS — R51 Headache: Secondary | ICD-10-CM | POA: Diagnosis not present

## 2018-10-15 DIAGNOSIS — R69 Illness, unspecified: Secondary | ICD-10-CM | POA: Diagnosis not present

## 2018-10-17 ENCOUNTER — Telehealth: Payer: Self-pay | Admitting: Pulmonary Disease

## 2018-10-17 DIAGNOSIS — Z6830 Body mass index (BMI) 30.0-30.9, adult: Secondary | ICD-10-CM | POA: Diagnosis not present

## 2018-10-17 DIAGNOSIS — J9801 Acute bronchospasm: Secondary | ICD-10-CM | POA: Diagnosis not present

## 2018-10-17 DIAGNOSIS — J159 Unspecified bacterial pneumonia: Secondary | ICD-10-CM | POA: Diagnosis not present

## 2018-10-17 DIAGNOSIS — I10 Essential (primary) hypertension: Secondary | ICD-10-CM | POA: Diagnosis not present

## 2018-10-17 NOTE — Telephone Encounter (Signed)
Primary Pulmonologist:[facility] Last office visit and with whom: Elsworth Soho- 09/13/2018 What do we see them for (pulmonary problems):Sarcoid Last OV assessment/plan:  Assessment & Plan:      Assessment & Plan Note by Rigoberto Noel, MD at 09/13/2018 10:39 AM  Author: Rigoberto Noel, MD Author Type: Physician Filed: 09/13/2018 10:40 AM  Note Status: Written Cosign: Cosign Not Required Encounter Date: 09/13/2018  Problem: Mass of middle lobe of right lung  Editor: Rigoberto Noel, MD (Physician)    Resolved on CT, due to sarcoidosis    Assessment & Plan Note by Rigoberto Noel, MD at 09/13/2018 10:39 AM  Author: Rigoberto Noel, MD Author Type: Physician Filed: 09/13/2018 10:39 AM  Note Status: Written Cosign: Cosign Not Required Encounter Date: 09/13/2018  Problem: Sarcoidosis  Editor: Rigoberto Noel, MD (Physician)    Will attempt to taper off prednisone but concerned that as we do this, disease may relapse  Stay on 5 mg of prednisone On April 1, decrease to 5 mg Monday/Wednesday/Friday  CT chest without contrast prior to next visit in 3 months    Patient Instructions by Rigoberto Noel, MD at 09/13/2018 9:45 AM  Author: Rigoberto Noel, MD Author Type: Physician Filed: 09/13/2018 10:34 AM  Note Status: Addendum Cosign: Cosign Not Required Encounter Date: 09/13/2018  Editor: Rigoberto Noel, MD (Physician)  Prior Versions: 1. Rigoberto Noel, MD (Physician) at 09/13/2018 10:34 AM - Signed    Stay on 5 mg of prednisone On April 1, decrease to 5 mg Monday/Wednesday/Friday  CT chest without contrast prior to next visit in 3 months       Was appointment offered to patient (explain)?  Pt prefers to stay home due to covid 19 concerns   Reason for call: Pt c/o increased cough x 3 days- prod with yellow sputum. He has had minimal wheezing also. He denies recent travel, sick contacts, f/c/s, CP, chest tightness, SOB, aches or other co's. His PCP already started him on augmentin(pt unsure of strength) 3  days ago when symptoms started, but he feels he is worsening. He is taking maintenance pred 5 mg daily. Please advise thanks!

## 2018-10-17 NOTE — Telephone Encounter (Signed)
I have called and spoken with the patient. He states [place] the beach to get away from the [place]. He states he has had terrible allergies since being at the [place]. He feels this is due to the [place] pollen.He developed a runny nose and cough. He had a telephone visit with his PCP. He was prescribed nebulizer treatments and Augmentin in 10/15/18.He called here because he feels he is getting worse not better. His cough is worse. He is now coughing up yellow secretions. He does not have fever, but he does have body aches. He feels like he did 2 years ago when he had pneumonia. I have encouraged him to [place] and [place] appropriately, as he is already on an antibiotic and not getting  better. He states he does have access to healthcare there. He was treated there in the past. He states he will go and get a CXR. He does have masks. I have encouraged him to wear a mask outside and when he goes into the facility for a CXR to protect both himself and others. He verbalized understanding. He had no further questions.

## 2018-10-25 ENCOUNTER — Other Ambulatory Visit: Payer: Self-pay | Admitting: Pulmonary Disease

## 2018-11-10 DIAGNOSIS — R69 Illness, unspecified: Secondary | ICD-10-CM | POA: Diagnosis not present

## 2018-11-16 ENCOUNTER — Telehealth: Payer: Self-pay | Admitting: Pulmonary Disease

## 2018-11-16 MED ORDER — PREDNISONE 5 MG PO TABS: ORAL_TABLET | ORAL | 1 refills | Status: DC

## 2018-11-16 NOTE — Telephone Encounter (Signed)
Called and spoke with patient. He is supposed to have a scan done before he comes to see [place].  He was told by the test scheduler they would not be doing this until July. Recall placed to see Dr. Elsworth Soho is August 2020. Instructed patient to call us if having any changes or needs anything.  Nothing further needed.

## 2018-11-16 NOTE — Telephone Encounter (Signed)
Take prednisone 5mg  on M/W/F until next follow up. Will send in refill

## 2018-11-16 NOTE — Telephone Encounter (Signed)
Patient last seen in clinic 09/13/18 by Dr.Alva. OV note stated for the patient to:   "Stay on 5 mg of prednisone On April 1, decrease to 5 mg Monday/Wednesday/Friday"  Message routed to APP of the day, Geraldo Pitter to confirm if taking 3 days a week should remain since the CT has to be delayed at this time.  Beth, please see above and advise. And can a prescription be sent to the pharmacy if he does not have enough prednisone?

## 2018-11-21 NOTE — Telephone Encounter (Signed)
non

## 2018-12-11 DIAGNOSIS — R69 Illness, unspecified: Secondary | ICD-10-CM | POA: Diagnosis not present

## 2018-12-28 DIAGNOSIS — N401 Enlarged prostate with lower urinary tract symptoms: Secondary | ICD-10-CM | POA: Diagnosis not present

## 2018-12-28 DIAGNOSIS — Z1331 Encounter for screening for depression: Secondary | ICD-10-CM | POA: Diagnosis not present

## 2018-12-28 DIAGNOSIS — D692 Other nonthrombocytopenic purpura: Secondary | ICD-10-CM | POA: Diagnosis not present

## 2018-12-28 DIAGNOSIS — J849 Interstitial pulmonary disease, unspecified: Secondary | ICD-10-CM | POA: Diagnosis not present

## 2018-12-28 DIAGNOSIS — I10 Essential (primary) hypertension: Secondary | ICD-10-CM | POA: Diagnosis not present

## 2018-12-28 DIAGNOSIS — D869 Sarcoidosis, unspecified: Secondary | ICD-10-CM | POA: Diagnosis not present

## 2018-12-28 DIAGNOSIS — G4700 Insomnia, unspecified: Secondary | ICD-10-CM | POA: Diagnosis not present

## 2018-12-28 DIAGNOSIS — I679 Cerebrovascular disease, unspecified: Secondary | ICD-10-CM | POA: Diagnosis not present

## 2018-12-28 DIAGNOSIS — E7800 Pure hypercholesterolemia, unspecified: Secondary | ICD-10-CM | POA: Diagnosis not present

## 2019-01-13 ENCOUNTER — Other Ambulatory Visit: Payer: Self-pay | Admitting: Primary Care

## 2019-01-14 DIAGNOSIS — R69 Illness, unspecified: Secondary | ICD-10-CM | POA: Diagnosis not present

## 2019-02-11 DIAGNOSIS — R69 Illness, unspecified: Secondary | ICD-10-CM | POA: Diagnosis not present

## 2019-02-12 ENCOUNTER — Telehealth: Payer: Self-pay | Admitting: *Deleted

## 2019-02-12 NOTE — Telephone Encounter (Signed)

## 2019-02-13 ENCOUNTER — Telehealth: Payer: Self-pay | Admitting: Primary Care

## 2019-02-13 ENCOUNTER — Other Ambulatory Visit: Payer: Self-pay

## 2019-02-13 ENCOUNTER — Ambulatory Visit: Payer: Medicare HMO | Admitting: Primary Care

## 2019-02-13 ENCOUNTER — Encounter: Payer: Self-pay | Admitting: Primary Care

## 2019-02-13 ENCOUNTER — Encounter (INDEPENDENT_AMBULATORY_CARE_PROVIDER_SITE_OTHER): Payer: Self-pay

## 2019-02-13 ENCOUNTER — Ambulatory Visit (INDEPENDENT_AMBULATORY_CARE_PROVIDER_SITE_OTHER)
Admission: RE | Admit: 2019-02-13 | Discharge: 2019-02-13 | Disposition: A | Discharge status: Home / Self Care | Payer: Medicare HMO | Source: Ambulatory Visit | Attending: Pulmonary Disease | Admitting: Pulmonary Disease

## 2019-02-13 DIAGNOSIS — D869 Sarcoidosis, unspecified: Secondary | ICD-10-CM | POA: Diagnosis not present

## 2019-02-13 DIAGNOSIS — R918 Other nonspecific abnormal finding of lung field: Secondary | ICD-10-CM | POA: Diagnosis not present

## 2019-02-13 MED ORDER — PREDNISONE 5 MG PO TABS: ORAL_TABLET | ORAL | 0 refills | Status: DC

## 2019-02-13 NOTE — Assessment & Plan Note (Addendum)
-   Clinically stable - CT chest 02/13/2019 showed stable findings consistent with sarcoidosis compared to 07/2018. No progression.  - Decreased prednisone 2.5mg  MWF  - Follow up in 3-4 months

## 2019-02-13 NOTE — Patient Instructions (Signed)
Decreased prednisone 2.5mg  Monday, Wednesday, Friday (likely for the next 2-4 weeks, will check with Dr. Elsworth Soho on duration)  CT chest today showed stable disease, no progression   Follow-up Dr. Elsworth Soho in 3-4 months

## 2019-02-13 NOTE — Telephone Encounter (Signed)
Called patient to update him on prednisone taper.  Patient to take 2.5mg  prednisone MWF x4 weeks; then 2.5mg  MF x 4 weeks; then stop.

## 2019-02-13 NOTE — Progress Notes (Signed)
@Patient  ID: [name]., male    DOB: [birth year], 73 y.o.   MRN: [id number]  Chief Complaint  Patient presents with  . Follow-up    F/U after CT scan. Had CT this morning.     Referring provider: Tisovec, Fransico Him, MD  HPI: 73 year old male, former smoker quit in 1988.  PMH significant for abnormal PFTs with restrictive lung disease, GERD, hypertension. Patient of Dr. Elsworth Soho.   Seen for initial pulmonary consult in August 2019.  Patient had a HRCT scan to rule out ILD, CT showed calcified and noncalcified mediastinal and upper abdominal adenopathy with progression. Hilar calcification and masslike soft tissue with near complete obstruction on the right middle lobe bronchus and associated right middle lobe volume loss.  He had a bronchoscopy in September 2019 that showed endobronchial lesion blocking the right middle lobe bronchus.  Biopsy positive for granuloma, pathology and cytology negative for malignant cells.  Patient was started on empiric prednisone for possible underlying sarcoidosis.  PET scan completed in October after being on prednisone for 4 weeks showed numerous small hypermetabolic mediastinal and hilar lymph nodes.  Right hilar adenopathy and adjacent interstitial lung disease is hypermetabolic. Dense airspace consolidation of the right lower lobe has improved along with improved surrounding airspace nodularity.  Numerous peri-portal and retroperitoneal lymph nodes are hypermetabolic.   02/13/2019 Patient presents today for 3 month follow-up with CT chest. He feels at his baseline. No significant cough, he did have URI in march that was treated by his PCP with Augmentin. Experiences some dyspnea symptoms when he really exerts himself. Pulmonary rehab was not covered under patient insurance plan and states that he would have to pay 5,000 dollar out of pocket. 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 He is interested in tapering off prednisone, he is hopeful it  will help him lose weight. CT chest today showed stable disease consistent with sarcoidosis, no progression.   Significant testing: CT chest 07/10/18- Stable calcified mediastinal and right hilar lymphadenopathy. Improving peribronchovascular nodularity, right lung predominant.These findings remain compatible pulmonary and mediastinal Sarcoidosis.  CT chest 02/13/2019 - Stable chest CT compared to 07/10/2018. Multiple small calcified mediastinal and hilar lymph nodes consistent with sarcoidosis. Parenchymal nodularity with subpleural reticulation as well as perihilar bronchovascular thickening in the RIGHT lung is unchanged and consistent with pulmonary sarcoidosis. No progression. LEFT lung is relatively spared with mild nodularity in the upper lobe unchanged.  Allergies  Allergen Reactions  . Hydrocodone Itching    Immunization History  Administered Date(s) Administered  . Influenza, High Dose Seasonal PF 05/01/2017, 06/25/2018  . Pneumococcal-Unspecified 01/29/2014    Past Medical History:  Diagnosis Date  . Arthritis    KNEES   . Cerebrovascular accident involving posterior circulation ([place]) 04/02/2015  . Essential hypertension   . GERD 05/19/2008   Qualifier: Diagnosis of  By: [name] MD, [name]   . History of right bundle branch block (RBBB)   . HLD (hyperlipidemia)   . INGUINAL HERNIA, LEFT 05/29/2008   Qualifier: Diagnosis of  By: [name] MD, [name]   . Neuropathy   . Skin cancer    hx of  . Syncope and collapse     Tobacco History: Social History   Tobacco Use  Smoking Status Former Smoker  . Packs/day: 4.00  . Years: 24.00  . Pack years: 96.00  . Quit date: 08/01/1986  . Years since quitting: 32.5  Smokeless Tobacco Never Used   Counseling given: Not  Answered   Outpatient Medications Prior to Visit  Medication Sig Dispense Refill  . albuterol (PROVENTIL HFA;VENTOLIN HFA) 108 (90 Base) MCG/ACT inhaler Inhale 2 puffs into the lungs every 6 (six) hours as needed  for wheezing or shortness of breath. 1 Inhaler 6  . atorvastatin (LIPITOR) 80 MG tablet Take 40 mg by mouth at bedtime.     . cetirizine (ZYRTEC) 10 MG tablet Take 10 mg by mouth daily.    . clopidogrel (PLAVIX) 75 MG tablet Take 75 mg by mouth daily.    . clotrimazole-betamethasone (LOTRISONE) cream Apply 1 application topically 2 (two) times daily. 30 g 0  . famotidine (PEPCID) 20 MG tablet Take 20 mg by mouth daily.    Marland Kitchen ketoconazole (NIZORAL) 2 % shampoo APPLY TO SCALP DAILY  2  . losartan (COZAAR) 100 MG tablet Take 100 mg by mouth daily.  2  . pantoprazole (PROTONIX) 40 MG tablet Take 40 mg by mouth daily.     . predniSONE (DELTASONE) 5 MG tablet STARTING October 31 2018 START TAKING 5MG  TABLET ON MON/WED/FRIDAY ONLY 30 tablet 1  . tamsulosin (FLOMAX) 0.4 MG CAPS capsule Take 0.4 mg by mouth at bedtime.      No facility-administered medications prior to visit.    Review of Systems  Review of Systems  Constitutional: Negative.   HENT: Negative.   Respiratory: Negative for cough, shortness of breath and wheezing.        Dyspnea on exertion   Physical Exam  BP 120/70   Pulse 86   Temp 97.6 F (36.4 C) (Oral)   Ht 6\' 2"  (1.88 m)   Wt 237 lb 12.8 oz (107.9 kg)   SpO2 96%   BMI 30.53 kg/m  Physical Exam Constitutional:      Appearance: Normal appearance.  HENT:     Head: Normocephalic and atraumatic.     Right Ear: Tympanic membrane normal.     Left Ear: Tympanic membrane normal.     Mouth/Throat:     Mouth: Mucous membranes are moist.     Pharynx: Oropharynx is clear.  Neck:     Musculoskeletal: Normal range of motion and neck supple.  Cardiovascular:     Rate and Rhythm: Normal rate and regular rhythm.  Pulmonary:     Effort: Pulmonary effort is normal.     Breath sounds: Normal breath sounds.  Musculoskeletal: Normal range of motion.  Skin:    General: Skin is warm and dry.  Neurological:     General: No focal deficit present.     Mental Status: He is alert and  oriented to person, place, and time. Mental status is at baseline.  Psychiatric:        Mood and Affect: Mood normal.        Behavior: Behavior normal.        Thought Content: Thought content normal.        Judgment: Judgment normal.      Lab Results:  CBC    Component Value Date/Time   WBC 9.8 04/27/2015 1002   RBC 4.89 04/27/2015 1002   HGB 15.1 04/27/2015 1002   HCT 45.7 04/27/2015 1002   PLT 221.0 04/27/2015 1002   MCV 93.3 04/27/2015 1002   MCH 31.4 04/06/2015 1331   MCHC 33.1 04/27/2015 1002   RDW 13.2 04/27/2015 1002   LYMPHSABS 2.0 04/27/2015 1002   MONOABS 1.2 (H) 04/27/2015 1002   EOSABS 0.4 04/27/2015 1002   BASOSABS 0.0 04/27/2015 1002  BMET    Component Value Date/Time   NA 140 07/10/2018 0739   K 3.8 07/10/2018 0739   CL 105 07/10/2018 0739   CO2 26 07/10/2018 0739   GLUCOSE 93 07/10/2018 0739   BUN 28 (H) 07/10/2018 0739   CREATININE 1.38 07/10/2018 0739   CALCIUM 8.9 07/10/2018 0739   GFRNONAA 46 (L) 04/06/2015 1331   GFRAA 53 (L) 04/06/2015 1331    BNP    Component Value Date/Time   BNP 79.2 01/16/2018 1008    ProBNP No results found for: PROBNP  Imaging: Ct Chest Wo Contrast  Result Date: 02/13/2019 CLINICAL DATA:  Sarcoidosis EXAM: CT CHEST WITHOUT CONTRAST TECHNIQUE: Multidetector CT imaging of the chest was performed following the standard protocol without IV contrast. COMPARISON:  CT 07/10/2018 FINDINGS: Cardiovascular: Coronary artery calcification and aortic atherosclerotic calcification. Mediastinum/Nodes: Multiple small calcified mediastinal lymph nodes and hilar lymph nodes are unchanged. Largest node is a. No new adenopathy Lungs/Pleura: Parenchymal nodules in the RIGHT upper lobe are similar prior. Example RIGHT upper lobe nodule measuring 8 mm (image 42/3) is unchanged from 8 mm. Cluster of nodules in the inferior RIGHT upper lobe measuring approximately 1 cm each are also unchanged (image 75/3). Subpleural reticulation in the  more anterior RIGHT upper lobe unchanged. There is peribronchovascular thickening about the RIGHT hilum unchanged (image 85). LEFT lung is relatively spared several nodules in the LEFT upper lobe unchanged. Upper Abdomen: No upper abdominal adenopathy.  Spleen normal. Musculoskeletal: No aggressive osseous lesion IMPRESSION: 1. Stable chest CT compared to 07/10/2018. 2. Multiple small calcified mediastinal and hilar lymph nodes consistent with sarcoidosis. 3. Parenchymal nodularity with subpleural reticulation as well as perihilar bronchovascular thickening in the RIGHT lung is unchanged and consistent with pulmonary sarcoidosis. No progression. 4. LEFT lung is relatively spared with mild nodularity in the upper lobe unchanged. Electronically Signed   By: Suzy Bouchard M.D.   On: 02/13/2019 11:15     Assessment & Plan:   Sarcoidosis - Clinically stable - CT chest 02/13/2019 showed stable findings consistent with sarcoidosis compared to 07/2018. No progression.  - Decreased prednisone 2.5mg  MWF  - Follow up in 3-4 months    Martyn Ehrich, NP 02/13/2019

## 2019-02-13 NOTE — Telephone Encounter (Signed)
-----   Message from Rigoberto Noel, MD sent at 02/13/2019  4:46 PM EDT ----- He has been on steroids for a while Stay on this dose for 4 weeks, then go to Monday/Friday for 4 weeks-then stop ----- Message ----- From: Martyn Ehrich, NP Sent: 02/13/2019  12:39 PM EDT To: Rigoberto Noel, MD  Sarcoid patient of yours, clinically stable. CT chest showed no progression. Patient wants to taper off prednisone. Plan decreased 2.5mg  MWF  How long would you recommend he being on this before stopping???  -Beth

## 2019-03-10 ENCOUNTER — Other Ambulatory Visit: Payer: Self-pay | Admitting: Primary Care

## 2019-04-10 DIAGNOSIS — L821 Other seborrheic keratosis: Secondary | ICD-10-CM | POA: Diagnosis not present

## 2019-04-10 DIAGNOSIS — Z85828 Personal history of other malignant neoplasm of skin: Secondary | ICD-10-CM | POA: Diagnosis not present

## 2019-04-10 DIAGNOSIS — L570 Actinic keratosis: Secondary | ICD-10-CM | POA: Diagnosis not present

## 2019-04-10 DIAGNOSIS — L820 Inflamed seborrheic keratosis: Secondary | ICD-10-CM | POA: Diagnosis not present

## 2019-04-10 DIAGNOSIS — L309 Dermatitis, unspecified: Secondary | ICD-10-CM | POA: Diagnosis not present

## 2019-04-10 DIAGNOSIS — D485 Neoplasm of uncertain behavior of skin: Secondary | ICD-10-CM | POA: Diagnosis not present

## 2019-06-11 ENCOUNTER — Ambulatory Visit: Payer: Medicare HMO | Admitting: Pulmonary Disease

## 2019-06-11 ENCOUNTER — Ambulatory Visit (INDEPENDENT_AMBULATORY_CARE_PROVIDER_SITE_OTHER): Payer: Medicare HMO

## 2019-06-11 ENCOUNTER — Other Ambulatory Visit: Payer: Self-pay

## 2019-06-11 ENCOUNTER — Encounter: Payer: Self-pay | Admitting: Pulmonary Disease

## 2019-06-11 DIAGNOSIS — Z23 Encounter for immunization: Secondary | ICD-10-CM

## 2019-06-11 DIAGNOSIS — R918 Other nonspecific abnormal finding of lung field: Secondary | ICD-10-CM | POA: Diagnosis not present

## 2019-06-11 DIAGNOSIS — D869 Sarcoidosis, unspecified: Secondary | ICD-10-CM

## 2019-06-11 NOTE — Addendum Note (Signed)
Addended by: Jannette Spanner on: 06/11/2019 11:58 AM   Modules accepted: Orders

## 2019-06-11 NOTE — Assessment & Plan Note (Signed)
Flu shot today. Chest x-ray today. CT chest from 01/2019 personally reviewed which shows marked improvement in right lower lobe airspace disease  Sarcoidosis seems to be in remission.  PFTs next visit in 6 months

## 2019-06-11 NOTE — Patient Instructions (Signed)
Flu shot today. Chest x-ray today.  Sarcoidosis seems to be in remission.  PFTs next visit in 6 months

## 2019-06-11 NOTE — Progress Notes (Signed)
   Subjective:    Patient ID: [name]., male    DOB: [birth year], 73 y.o.   MRN: [id number]  HPI  73 yo ex- smoker quit in 1988,initially seen for restrictive lung disease on PFTs,consolidation on CT with mediastinal calcified lymphadenopathy, biopsy showing granulomas, presumptive diagnosis of sarcoidosis Prednisone was started at 20 mg after bronchoscopy in 04/2018 and gradually tapered to off by July 2020  His breathing is improved, but still has occasional cough, no nocturnal symptoms. He is trying to lose weight and [place] He is social distancing and masking No visual issues, no skin lesions  Significant tests/ events reviewed  PFTs 01/2018 moderate intraparenchymal restriction, ratio 78, FEV1 69%, FVC 65%, TLC 60%, DLCO 48% but corrects to 85% for alveolar volume  Spirometry 07/2018-FVC 68%   CT chest 02/13/2019 - Stable chest CT compared to 07/10/2018. Multiple small calcified mediastinal and hilar lymph nodes consistent with sarcoidosis. Parenchymal nodularity with subpleural reticulation as well as perihilar bronchovascular thickening in the RIGHT lung is unchanged and consistent with pulmonary sarcoidosis. No progression. LEFT lung is relatively spared with mild nodularity in the upper lobe unchanged.  8/2019HRCT>>calcified and noncalcified mediastinal and upper abdominal adenopathy with progression. Hilarcalcification and masslike soft tissue with near complete obstruction on the right middle lobe bronchus and associated right middle lobe volume loss.   9/2019bronchoscopy in September 2019 that showed endobronchial lesion blocking the right middle lobe bronchus. Biopsy positive for granuloma, pathology and cytology negative for malignant cells.  PET 05/2018 >>consistent with sarcoid with hypermetabolic mediastinal and hilar adenopathy and interstitial lung/alveolar lung disease and hypermetabolic abdominal lymph  nodes  CT chest 12/2019improved right lower lobe aeration   Review of Systems  neg for any significant sore throat, dysphagia, itching, sneezing, nasal congestion or excess/ purulent secretions, fever, chills, sweats, unintended wt loss, pleuritic or exertional cp, hempoptysis, orthopnea pnd or change in chronic leg swelling. Also denies presyncope, palpitations, heartburn, abdominal pain, nausea, vomiting, diarrhea or change in bowel or urinary habits, dysuria,hematuria, rash, arthralgias, visual complaints, headache, numbness weakness or ataxia.     Objective:   Physical Exam   Gen. Pleasant, well-nourished, in no distress ENT - no thrush, no pallor/icterus,no post nasal drip Neck: No JVD, no thyromegaly, no carotid bruits Lungs: no use of accessory muscles, no dullness to percussion, clear without rales or rhonchi  Cardiovascular: Rhythm regular, heart sounds  normal, no murmurs or gallops, no peripheral edema Musculoskeletal: No deformities, no cyanosis or clubbing          Assessment & Plan:

## 2019-06-13 ENCOUNTER — Telehealth: Payer: Self-pay | Admitting: Pulmonary Disease

## 2019-06-13 NOTE — Telephone Encounter (Signed)
Notes recorded by Rigoberto Noel, MD on 06/12/2019 at 9:06 AM EST  Changes in RT mid lung as noted before in feb 2020 c/w sarcoid  Spoke with patient. He verbalized understanding of results. Nothing further needed at time of call.

## 2019-07-05 DIAGNOSIS — K219 Gastro-esophageal reflux disease without esophagitis: Secondary | ICD-10-CM | POA: Diagnosis not present

## 2019-07-05 DIAGNOSIS — N401 Enlarged prostate with lower urinary tract symptoms: Secondary | ICD-10-CM | POA: Diagnosis not present

## 2019-07-05 DIAGNOSIS — Z1389 Encounter for screening for other disorder: Secondary | ICD-10-CM | POA: Diagnosis not present

## 2019-07-05 DIAGNOSIS — H9319 Tinnitus, unspecified ear: Secondary | ICD-10-CM | POA: Diagnosis not present

## 2019-07-05 DIAGNOSIS — G4700 Insomnia, unspecified: Secondary | ICD-10-CM | POA: Diagnosis not present

## 2019-07-05 DIAGNOSIS — I10 Essential (primary) hypertension: Secondary | ICD-10-CM | POA: Diagnosis not present

## 2019-07-05 DIAGNOSIS — D692 Other nonthrombocytopenic purpura: Secondary | ICD-10-CM | POA: Diagnosis not present

## 2019-07-05 DIAGNOSIS — I679 Cerebrovascular disease, unspecified: Secondary | ICD-10-CM | POA: Diagnosis not present

## 2019-07-05 DIAGNOSIS — Z0000 Encounter for general adult medical examination without abnormal findings: Secondary | ICD-10-CM | POA: Diagnosis not present

## 2019-07-05 DIAGNOSIS — E7800 Pure hypercholesterolemia, unspecified: Secondary | ICD-10-CM | POA: Diagnosis not present

## 2019-07-05 DIAGNOSIS — R69 Illness, unspecified: Secondary | ICD-10-CM | POA: Diagnosis not present

## 2019-11-26 IMAGING — MR MR HEAD W/O CM
10 series · 48 of 48 positions shown · non-contrast
Comparison: None.

CLINICAL DATA: Frontal headache. Stroke 3 years ago with history of
blurred vision. New daily persistent headaches.

EXAM:
MRI HEAD WITHOUT CONTRAST
TECHNIQUE: Multiplanar, multiecho pulse sequences of the brain and surrounding
structures were obtained without intravenous contrast.

[Series 2: t1_se_sag · sagittal · 5.0mm · 0.47mm/px · 2 of 27 slices shown]
[im 1/27]
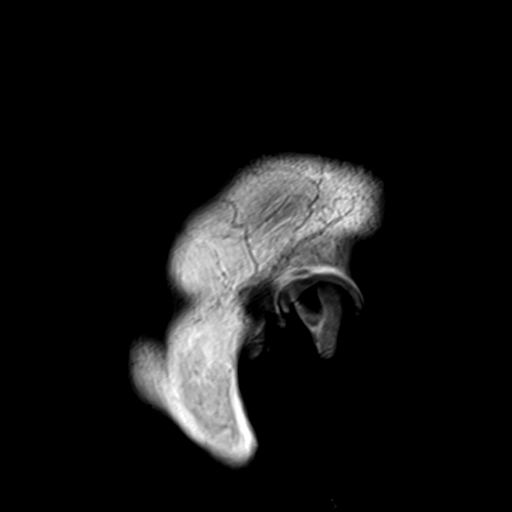
[im 27/27]
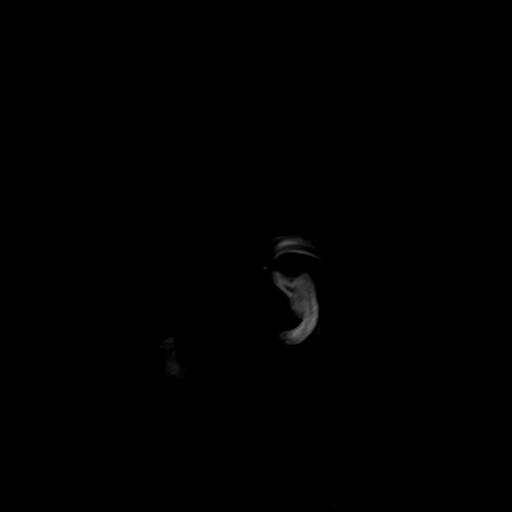

[Series 3: ep2d_diff_(id)_trace · axial · 3.0mm · 1.88mm/px · z∈[-70,+92]mm · 8 of 107 slices shown]
[im 1/107]
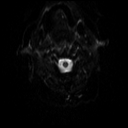
[im 16/107]
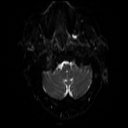
[im 31/107]
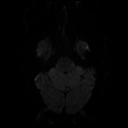
[im 46/107]
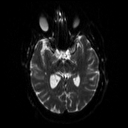
[im 61/107]
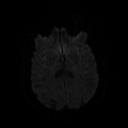
[im 76/107]
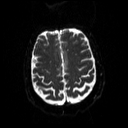
[im 91/107]
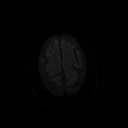
[im 107/107]
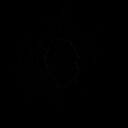

[Series 4: ep2d_diff_(id)_trace_adc · axial · 3.0mm · 1.88mm/px · z∈[-70,+92]mm · 4 of 51 slices shown]
[im 1/51]
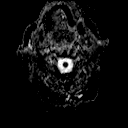
[im 17/51]
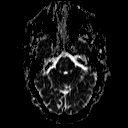
[im 34/51]
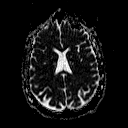
[im 51/51]
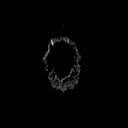

[Series 5: ep2d_diff_cor · coronal · 5.0mm · 1.77mm/px · 5 of 65 slices shown]
[im 1/65]
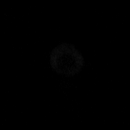
[im 17/65]
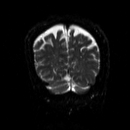
[im 33/65]
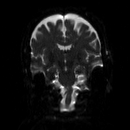
[im 49/65]
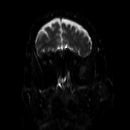
[im 65/65]
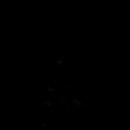

[Series 6: ep2d_diff_cor_adc · coronal · 5.0mm · 1.77mm/px · 3 of 33 slices shown]
[im 1/33]
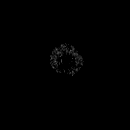
[im 17/33]
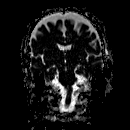
[im 33/33]
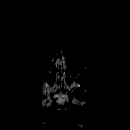

[Series 8: swi_images · axial · 2.0mm · 0.94mm/px · z∈[-86,+88]mm · 7 of 88 slices shown]
[im 1/88]
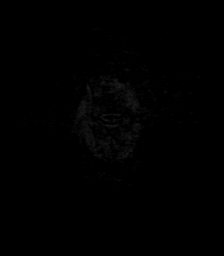
[im 15/88]
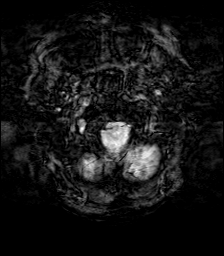
[im 30/88]
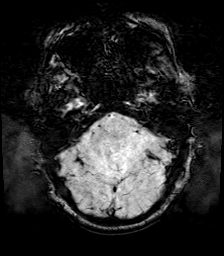
[im 44/88]
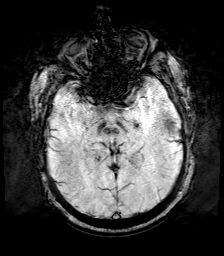
[im 59/88]
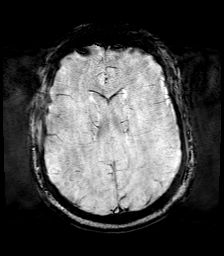
[im 73/88]
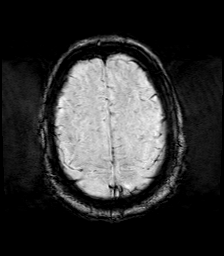
[im 88/88]
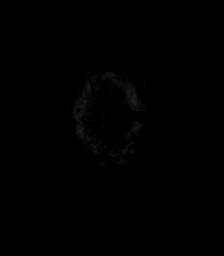

[Series 9: FLAIR · axial · 3.0mm · 0.47mm/px · z∈[-77,+103]mm · 2 of 31 slices shown]
[im 1/31]
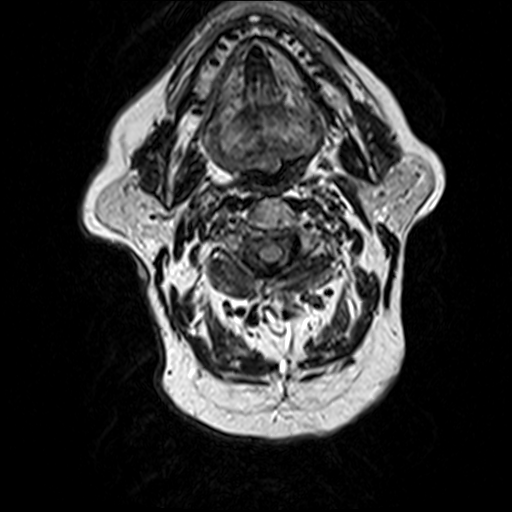
[im 31/31]
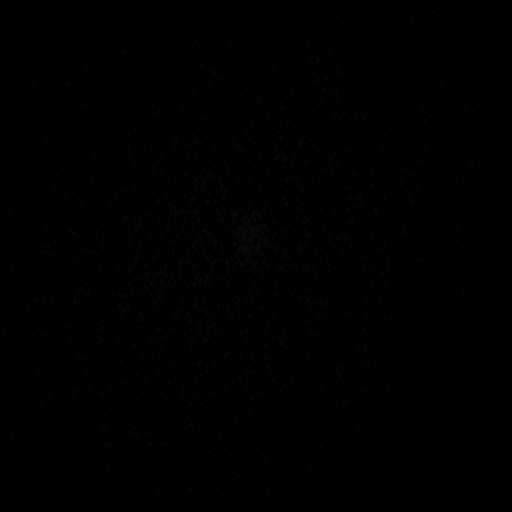

[Series 10: t2_tse_tra_512 · axial · 5.0mm · 0.62mm/px · z∈[-57,+111]mm · 2 of 29 slices shown]
[im 1/29]
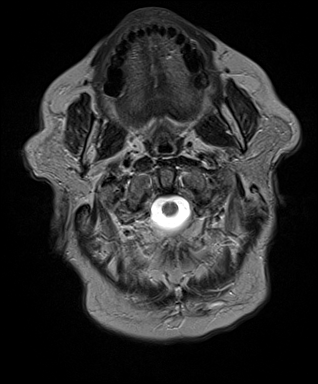
[im 29/29]
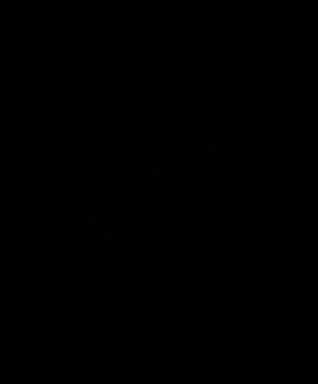

[Series 11: t1_mpr_tra · axial · 1.1mm · 0.75mm/px · z∈[-68,+106]mm · 12 of 160 slices shown]
[im 1/160]
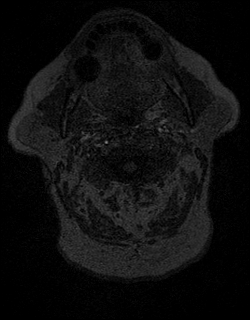
[im 15/160]
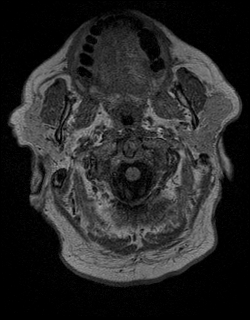
[im 29/160]
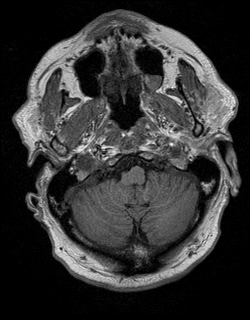
[im 44/160]
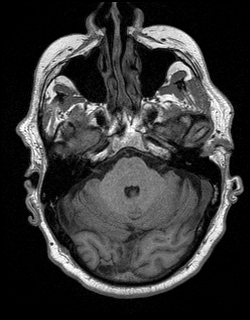
[im 58/160]
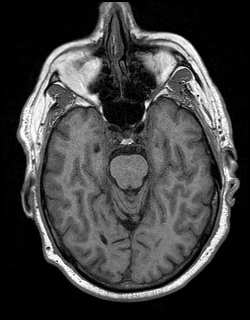
[im 73/160]
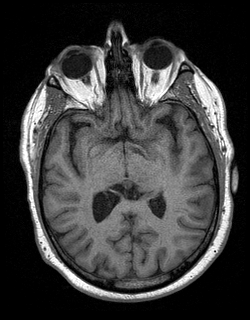
[im 87/160]
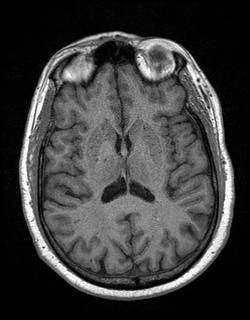
[im 102/160]
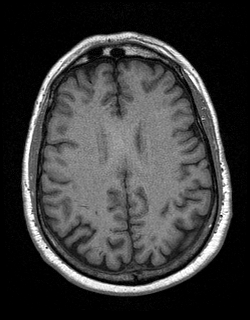
[im 116/160]
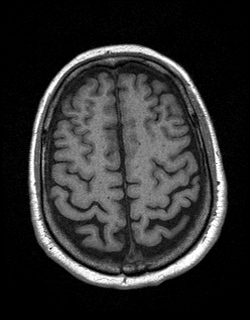
[im 131/160]
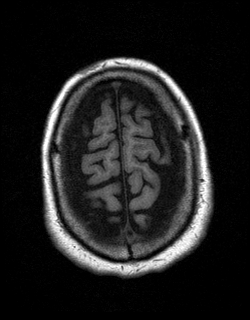
[im 145/160]
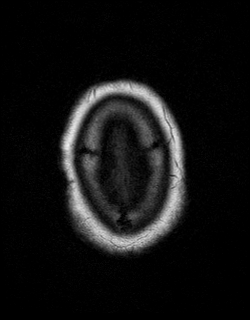
[im 160/160]
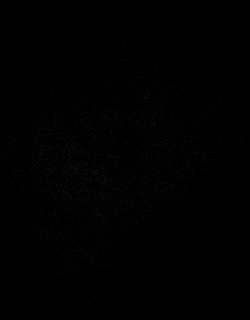

[Series 12: T2 · coronal · 5.0mm · 0.45mm/px · 3 of 34 slices shown]
[im 1/34]
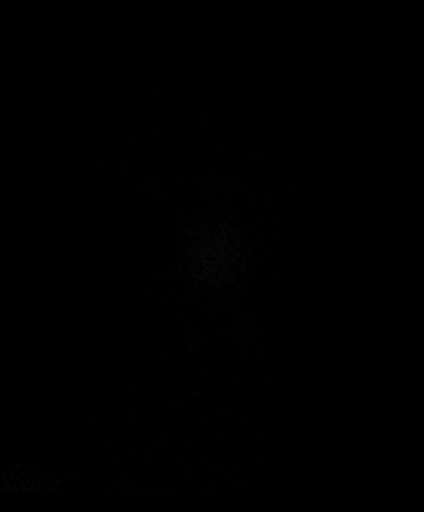
[im 17/34]
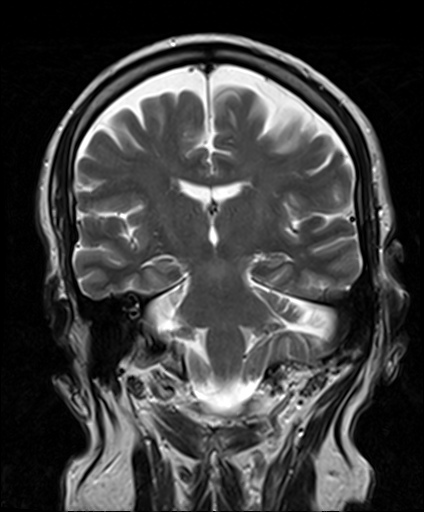
[im 34/34]
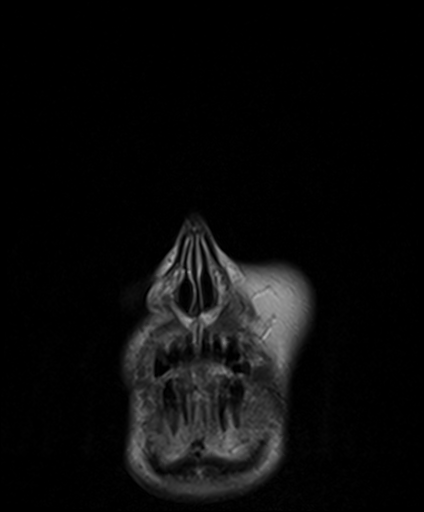

[48 of 48 positions shown; findings below may reference images not displayed]

FINDINGS: Brain: No acute infarct, hemorrhage, or mass lesion is present. Mild
atrophy and white matter changes are within normal limits for age. A
remote right occipital pole infarct is noted. Cerebellar tonsils
extend to the foramen magnum without extension through the foramen
magnum. The ventricles are of normal size. No significant extraaxial
fluid collection is present. The internal auditory canals are within
normal limits. The brainstem and cerebellum are within normal
limits.

Vascular: Flow is present in the major intracranial arteries.

Skull and upper cervical spine: The craniocervical junction is
normal. Upper cervical spine is within normal limits. Marrow signal
is unremarkable.

Sinuses/Orbits: A polyp or mucous retention cyst is present in the
left maxillary sinus The paranasal sinuses and mastoid air cells are
otherwise clear. Bilateral lens replacements are noted. Globes and
orbits are otherwise unremarkable.
IMPRESSION: 1. Remote nonhemorrhagic infarct of the right occipital pole.
2. Otherwise normal MRI appearance of the brain for age.
3. No acute or focal lesion to explain the patient's headaches.

## 2020-01-03 DIAGNOSIS — I10 Essential (primary) hypertension: Secondary | ICD-10-CM | POA: Diagnosis not present

## 2020-01-03 DIAGNOSIS — E7800 Pure hypercholesterolemia, unspecified: Secondary | ICD-10-CM | POA: Diagnosis not present

## 2020-01-03 DIAGNOSIS — J849 Interstitial pulmonary disease, unspecified: Secondary | ICD-10-CM | POA: Diagnosis not present

## 2020-01-03 DIAGNOSIS — D869 Sarcoidosis, unspecified: Secondary | ICD-10-CM | POA: Diagnosis not present

## 2020-03-12 ENCOUNTER — Telehealth: Payer: Self-pay | Admitting: Pulmonary Disease

## 2020-03-12 NOTE — Telephone Encounter (Signed)
Called and spoke with pt who stated he has a spot on his arm and chest that is about the size of a quarter that is red and also is irritated. Pt stated he noticed the spot on his arm about 2 weeks ago and then the spot on his chest popped up 2 days ago.  Pt is wanting to know if this could be related to the sarcoidosis or if it could be due to something else.  Dr. Elsworth Soho, please advise.

## 2020-03-12 NOTE — Telephone Encounter (Signed)
Possible. He would have to see a dermatologist on biopsy this to prove that this is sarcoid.   If otherwise asymptomatic,can take a wait and watch approach

## 2020-03-12 NOTE — Telephone Encounter (Signed)
Called and spoke with pt letting him know the info stated by RA and he verbalized understanding. Pt stated that he already has a dermatologist in [place] that he sees and stated he would contact them for an appt. Nothing further needed.

## 2020-04-09 DIAGNOSIS — L821 Other seborrheic keratosis: Secondary | ICD-10-CM | POA: Diagnosis not present

## 2020-04-09 DIAGNOSIS — L720 Epidermal cyst: Secondary | ICD-10-CM | POA: Diagnosis not present

## 2020-04-09 DIAGNOSIS — Z85828 Personal history of other malignant neoplasm of skin: Secondary | ICD-10-CM | POA: Diagnosis not present

## 2020-04-09 DIAGNOSIS — L570 Actinic keratosis: Secondary | ICD-10-CM | POA: Diagnosis not present

## 2020-04-09 DIAGNOSIS — L820 Inflamed seborrheic keratosis: Secondary | ICD-10-CM | POA: Diagnosis not present

## 2020-08-25 DIAGNOSIS — Z125 Encounter for screening for malignant neoplasm of prostate: Secondary | ICD-10-CM | POA: Diagnosis not present

## 2020-08-25 DIAGNOSIS — R82998 Other abnormal findings in urine: Secondary | ICD-10-CM | POA: Diagnosis not present

## 2020-08-25 DIAGNOSIS — L570 Actinic keratosis: Secondary | ICD-10-CM | POA: Diagnosis not present

## 2020-08-25 DIAGNOSIS — Z85828 Personal history of other malignant neoplasm of skin: Secondary | ICD-10-CM | POA: Diagnosis not present

## 2020-08-25 DIAGNOSIS — C44329 Squamous cell carcinoma of skin of other parts of face: Secondary | ICD-10-CM | POA: Diagnosis not present

## 2020-08-25 DIAGNOSIS — I10 Essential (primary) hypertension: Secondary | ICD-10-CM | POA: Diagnosis not present

## 2020-08-25 DIAGNOSIS — I700 Atherosclerosis of aorta: Secondary | ICD-10-CM | POA: Diagnosis not present

## 2020-08-25 DIAGNOSIS — Z0000 Encounter for general adult medical examination without abnormal findings: Secondary | ICD-10-CM | POA: Diagnosis not present

## 2020-08-25 DIAGNOSIS — D485 Neoplasm of uncertain behavior of skin: Secondary | ICD-10-CM | POA: Diagnosis not present

## 2020-08-25 DIAGNOSIS — I679 Cerebrovascular disease, unspecified: Secondary | ICD-10-CM | POA: Diagnosis not present

## 2020-08-25 DIAGNOSIS — E7800 Pure hypercholesterolemia, unspecified: Secondary | ICD-10-CM | POA: Diagnosis not present

## 2020-08-25 DIAGNOSIS — L718 Other rosacea: Secondary | ICD-10-CM | POA: Diagnosis not present

## 2020-08-28 DIAGNOSIS — Z7689 Persons encountering health services in other specified circumstances: Secondary | ICD-10-CM | POA: Diagnosis not present

## 2021-02-02 DIAGNOSIS — S0502XA Injury of conjunctiva and corneal abrasion without foreign body, left eye, initial encounter: Secondary | ICD-10-CM | POA: Diagnosis not present

## 2021-02-04 DIAGNOSIS — S0502XD Injury of conjunctiva and corneal abrasion without foreign body, left eye, subsequent encounter: Secondary | ICD-10-CM | POA: Diagnosis not present

## 2021-03-02 DIAGNOSIS — I679 Cerebrovascular disease, unspecified: Secondary | ICD-10-CM | POA: Diagnosis not present

## 2021-03-02 DIAGNOSIS — E7800 Pure hypercholesterolemia, unspecified: Secondary | ICD-10-CM | POA: Diagnosis not present

## 2021-03-02 DIAGNOSIS — I10 Essential (primary) hypertension: Secondary | ICD-10-CM | POA: Diagnosis not present

## 2021-03-02 DIAGNOSIS — I700 Atherosclerosis of aorta: Secondary | ICD-10-CM | POA: Diagnosis not present

## 2021-05-04 ENCOUNTER — Encounter: Payer: Self-pay | Admitting: Pulmonary Disease

## 2021-05-04 ENCOUNTER — Ambulatory Visit (INDEPENDENT_AMBULATORY_CARE_PROVIDER_SITE_OTHER)
Admission: RE | Admit: 2021-05-04 | Discharge: 2021-05-04 | Disposition: A | Discharge status: Home / Self Care | Payer: Medicare Other | Source: Ambulatory Visit | Attending: Pulmonary Disease | Admitting: Pulmonary Disease

## 2021-05-04 ENCOUNTER — Ambulatory Visit (INDEPENDENT_AMBULATORY_CARE_PROVIDER_SITE_OTHER): Payer: Self-pay | Admitting: Pulmonary Disease

## 2021-05-04 ENCOUNTER — Other Ambulatory Visit (INDEPENDENT_AMBULATORY_CARE_PROVIDER_SITE_OTHER): Payer: Medicare Other

## 2021-05-04 ENCOUNTER — Other Ambulatory Visit: Payer: Self-pay

## 2021-05-04 VITALS — BP 124/80 | HR 76 | Temp 98.3°F | Ht 73.0 in | Wt 240.2 lb

## 2021-05-04 DIAGNOSIS — D869 Sarcoidosis, unspecified: Secondary | ICD-10-CM

## 2021-05-04 DIAGNOSIS — I700 Atherosclerosis of aorta: Secondary | ICD-10-CM | POA: Diagnosis not present

## 2021-05-04 DIAGNOSIS — J479 Bronchiectasis, uncomplicated: Secondary | ICD-10-CM | POA: Diagnosis not present

## 2021-05-04 LAB — BASIC METABOLIC PANEL
BUN: 22 mg/dL (ref 6–23)
CO2: 28 mEq/L (ref 19–32)
Calcium: 8.8 mg/dL (ref 8.4–10.5)
Chloride: 106 mEq/L (ref 96–112)
Creatinine, Ser: 1.22 mg/dL (ref 0.40–1.50)
GFR: 58.06 mL/min — ABNORMAL LOW (ref >60.00)
Glucose, Bld: 92 mg/dL (ref 70–99)
Potassium: 3.8 mEq/L (ref 3.5–5.1)
Sodium: 141 mEq/L (ref 135–145)

## 2021-05-04 MED ORDER — IOHEXOL 350 MG/ML SOLN
80.0000 mL | Freq: Once | INTRAVENOUS | Status: AC | PRN
  Administered 2021-05-04: 80 mL via INTRAVENOUS

## 2021-05-04 NOTE — Progress Notes (Signed)
   Subjective:    Patient ID: [name]., male    DOB: 1946-04-01, 75 y.o.   MRN: [id number]  HPI  75 yo ex- smoker quit in 1988, initially seen for restrictive lung disease on PFTs, consolidation on CT with mediastinal calcified lymphadenopathy, biopsy showing granulomas, presumptive diagnosis of sarcoidosis  -on Prednisone from 04/2018 to July 2020  He lives at the [place] mostly and comes up to [place] for his appointments. He is complaining of increased shortness of breath, last office visit 06/2019 was reviewed. Chest x-ray 06/2019 independently reviewed shows right lower lobe scarring similar to prior.  CT chest had shown significant decrease in infiltrates consistent with improving sarcoidosis. He denies skin lesions, no chest pain, orthopnea or paroxysmal nocturnal dyspnea. No cough or wheezing.  Albuterol MDI does not seem to help  Significant tests/ events reviewed PFTs 01/2018 moderate intraparenchymal restriction, ratio 78, FEV1 69%, FVC 65%, TLC 60%, DLCO 48% but corrects to 85% for alveolar volume   Spirometry 07/2018-FVC 68%     CT chest 02/13/2019 - Stable chest CT compared to 07/10/2018. Multiple small calcified mediastinal and hilar lymph nodes consistent with sarcoidosis. Parenchymal nodularity with subpleural reticulation as well as perihilar bronchovascular thickening in the RIGHT lung is unchanged and consistent with pulmonary sarcoidosis. No progression. LEFT lung is relatively spared with mild nodularity in the upper lobe unchanged.   03/2018  HRCT >> calcified and noncalcified mediastinal and upper abdominal adenopathy with progression. Hilar calcification and masslike soft tissue with near complete obstruction on the right middle lobe bronchus and associated right middle lobe volume loss.       04/2018 bronchoscopy in September 2019 that showed endobronchial lesion blocking the right middle lobe bronchus.  Biopsy positive for granuloma, pathology and cytology  negative for malignant cells.     PET 05/2018 >> consistent with sarcoid with hypermetabolic mediastinal and hilar adenopathy and interstitial lung/alveolar lung disease and hypermetabolic abdominal lymph nodes   CT chest 07/2018 improved right lower lobe aeration    Past Medical History:  Diagnosis Date   Arthritis    KNEES    Cerebrovascular accident involving posterior circulation ([place]) 04/02/2015   Essential hypertension    GERD 05/19/2008   Qualifier: Diagnosis of  By: [name] MD, [name]    History of right bundle branch block (RBBB)    HLD (hyperlipidemia)    INGUINAL HERNIA, LEFT 05/29/2008   Qualifier: Diagnosis of  By: [name] MD, [name]    Neuropathy    Skin cancer    hx of   Syncope and collapse     Review of Systems neg for any significant sore throat, dysphagia, itching, sneezing, nasal congestion or excess/ purulent secretions, fever, chills, sweats, unintended wt loss, pleuritic or exertional cp, hempoptysis, orthopnea pnd or change in chronic leg swelling. Also denies presyncope, palpitations, heartburn, abdominal pain, nausea, vomiting, diarrhea or change in bowel or urinary habits, dysuria,hematuria, rash, arthralgias, visual complaints, headache, numbness weakness or ataxia.     Objective:   Physical Exam  Gen. Pleasant, obese, in no distress ENT - no lesions, no post nasal drip Neck: No JVD, no thyromegaly, no carotid bruits Lungs: no use of accessory muscles, no dullness to percussion, decreased without rales or rhonchi  Cardiovascular: Rhythm regular, heart sounds  normal, no murmurs or gallops, no peripheral edema Musculoskeletal: No deformities, no cyanosis or clubbing , no tremors       Assessment & Plan:

## 2021-05-04 NOTE — Patient Instructions (Signed)
  CT chest w con PFTs  Get blood work from dr [employer]

## 2021-05-04 NOTE — Assessment & Plan Note (Signed)
We will have to assess sarcoid activity with CT chest with contrast and PFTs. I have these show stable lung function and appearance, then may need cardiac work-up for his increased dyspnea

## 2021-05-07 ENCOUNTER — Other Ambulatory Visit: Payer: Self-pay | Admitting: Pulmonary Disease

## 2021-05-07 DIAGNOSIS — I2510 Atherosclerotic heart disease of native coronary artery without angina pectoris: Secondary | ICD-10-CM

## 2021-05-07 NOTE — Progress Notes (Signed)
Spoke with pt and notified of results. Pt verbalized understanding. Pt agreeable to cards referral and this has been placed.

## 2021-05-25 ENCOUNTER — Other Ambulatory Visit: Payer: Self-pay

## 2021-05-25 ENCOUNTER — Ambulatory Visit (INDEPENDENT_AMBULATORY_CARE_PROVIDER_SITE_OTHER): Payer: Medicare Other | Admitting: Pulmonary Disease

## 2021-05-25 DIAGNOSIS — D869 Sarcoidosis, unspecified: Secondary | ICD-10-CM | POA: Diagnosis not present

## 2021-05-25 LAB — PULMONARY FUNCTION TEST
DL/VA % pred: 105 %
DL/VA: 4.1 ml/min/mmHg/L
DLCO cor % pred: 65 %
DLCO cor: 18.43 ml/min/mmHg
DLCO unc % pred: 65 %
DLCO unc: 18.43 ml/min/mmHg
FEF 25-75 Post: 2.39 L/sec
FEF 25-75 Pre: 2.33 L/sec
FEF2575-%Change-Post: 2 %
FEF2575-%Pred-Post: 91 %
FEF2575-%Pred-Pre: 89 %
FEV1-%Change-Post: 0 %
FEV1-%Pred-Post: 69 %
FEV1-%Pred-Pre: 69 %
FEV1-Post: 2.49 L
FEV1-Pre: 2.51 L
FEV1FVC-%Change-Post: 0 %
FEV1FVC-%Pred-Pre: 109 %
FEV6-%Change-Post: -1 %
FEV6-%Pred-Post: 66 %
FEV6-%Pred-Pre: 67 %
FEV6-Post: 3.1 L
FEV6-Pre: 3.15 L
FEV6FVC-%Pred-Post: 105 %
FEV6FVC-%Pred-Pre: 105 %
FVC-%Change-Post: -1 %
FVC-%Pred-Post: 62 %
FVC-%Pred-Pre: 63 %
FVC-Post: 3.1 L
FVC-Pre: 3.15 L
Post FEV1/FVC ratio: 80 %
Post FEV6/FVC ratio: 100 %
Pre FEV1/FVC ratio: 80 %
Pre FEV6/FVC Ratio: 100 %
RV % pred: 66 %
RV: 1.85 L
TLC % pred: 62 %
TLC: 4.89 L

## 2021-05-25 NOTE — Progress Notes (Signed)
Full PFT performed today. °

## 2021-05-25 NOTE — Patient Instructions (Signed)
Full PFT performed today. °

## 2021-05-26 ENCOUNTER — Telehealth: Payer: Self-pay | Admitting: Pulmonary Disease

## 2021-05-26 NOTE — Telephone Encounter (Signed)
Rigoberto Noel, MD  05/25/2021  4:51 PM EDT     Lung function is low at about 65% but mostly unchanged from 3 years ago    Patient is aware of results and voiced his understanding.  Nothing further needed at this time.

## 2021-06-11 NOTE — Progress Notes (Signed)
Dahlia Client MD Reason for referral-coronary artery disease  HPI: 75 year old male for evaluation of coronary artery disease at request of Kara Mead MD. Patient seen previously by Dr. Percival Spanish but not since June 2019.  Carotid Dopplers September 2016 showed 1 to 39% left stenosis. Transesophageal echocardiogram October 2016 showed normal LV function, trace aortic insufficiency, moderate plaque in the descending aorta, mild mitral regurgitation, dilated left atrial appendage and negative saline microcavitation study.  Patient had implantable loop monitor placed secondary to CVA but no arrhythmias identified.  Stress echocardiogram July 2019 technically difficult but normal.  Chest CT October 2022 showed stigmata consistent with sarcoidosis.  There was also note of aortic atherosclerosis and calcification in the left main and LAD arteries.  Patient states he has had increasing dyspnea on exertion at home for the past several years.  It occurs with activities in the yard but not typically in the house.  He denies orthopnea, PND or pedal edema.  He occasionally has chest discomfort that is in the substernal area described as a heaviness without radiation.  He notices this sometimes after activities but occasionally at rest.  Resolves spontaneously.  No associated symptoms.  He denies syncope.  Cardiology now asked to evaluate.  Current Outpatient Medications  Medication Sig Dispense Refill   atorvastatin (LIPITOR) 80 MG tablet Take 40 mg by mouth at bedtime.      cetirizine (ZYRTEC) 10 MG tablet Take 10 mg by mouth daily.     clopidogrel (PLAVIX) 75 MG tablet Take 75 mg by mouth daily.     clotrimazole-betamethasone (LOTRISONE) cream Apply 1 application topically 2 (two) times daily. 30 g 0   famotidine (PEPCID) 20 MG tablet Take 20 mg by mouth daily.     ketoconazole (NIZORAL) 2 % shampoo APPLY TO SCALP DAILY  2   losartan (COZAAR) 100 MG tablet Take 100 mg by mouth daily.  2    pantoprazole (PROTONIX) 40 MG tablet Take 40 mg by mouth daily.      tamsulosin (FLOMAX) 0.4 MG CAPS capsule Take 0.4 mg by mouth at bedtime.      No current facility-administered medications for this visit.    Allergies  Allergen Reactions   Hydrocodone Itching     Past Medical History:  Diagnosis Date   Arthritis    KNEES    Cerebrovascular accident involving posterior circulation ([facility]) 04/02/2015   Essential hypertension    GERD 05/19/2008   Qualifier: Diagnosis of  By: [name] MD, [name]    History of right bundle branch block (RBBB)    HLD (hyperlipidemia)    INGUINAL HERNIA, LEFT 05/29/2008   Qualifier: Diagnosis of  By: [name] MD, [name]    Neuropathy    Sarcoid    Skin cancer    hx of   Syncope and collapse     Past Surgical History:  Procedure Laterality Date   CATARACT EXTRACTION     COLONOSCOPY     EP IMPLANTABLE DEVICE N/A 05/04/2015   Procedure: Loop Recorder Insertion;  Surgeon: Will Meredith Leeds, MD;  Location: [facility];  Service: Cardiovascular;  Laterality: N/A;   HERNIA REPAIR     KNEE SURGERY Right 12/31/2014   LOOP RECORDER REMOVAL N/A 02/28/2018   Procedure: LOOP RECORDER REMOVAL;  Surgeon: Constance Haw, MD;  Location: [facility];  Service: Cardiovascular;  Laterality: N/A;   SKIN CANCER EXCISION  12/31/2014   TEE WITHOUT CARDIOVERSION N/A 05/04/2015   Procedure: TRANSESOPHAGEAL ECHOCARDIOGRAM (  TEE);  Surgeon: Dorothy Spark, MD;  Location: [facility];  Service: Cardiovascular;  Laterality: N/A;   VIDEO BRONCHOSCOPY Bilateral 04/23/2018   Procedure: VIDEO BRONCHOSCOPY WITH FLUORO;  Surgeon: Rigoberto Noel, MD;  Location: [facility];  Service: Cardiopulmonary;  Laterality: Bilateral;    Social History   Socioeconomic History   Marital status: Married    Spouse name: Not on file   Number of children: 4   Years of education: Not on file   Highest education level: Not on file  Occupational History   Not on file   Tobacco Use   Smoking status: Former    Packs/day: 4.00    Years: 24.00    Pack years: 96.00    Types: Cigarettes    Quit date: 08/01/1986    Years since quitting: 34.9   Smokeless tobacco: Never  Substance and Sexual Activity   Alcohol use: Yes    Alcohol/week: 3.0 standard drinks    Types: 1 Glasses of wine, 1 Cans of beer, 1 Shots of liquor per week    Comment: occasional   Drug use: No   Sexual activity: Not on file  Other Topics Concern   Not on file  Social History Narrative   Not on file   Social Determinants of Health   Financial Resource Strain: Not on file  Food Insecurity: Not on file  Transportation Needs: Not on file  Physical Activity: Not on file  Stress: Not on file  Social Connections: Not on file  Intimate Partner Violence: Not on file    Family History  Problem Relation Age of Onset   Diabetes Mother    Kidney disease Mother    Emphysema Father    Prostate cancer Maternal Uncle    CAD Neg Hx        NEGATIVE HX OF PREMATURE CAD    ROS: no fevers or chills, productive cough, hemoptysis, dysphasia, odynophagia, melena, hematochezia, dysuria, hematuria, rash, seizure activity, orthopnea, PND, pedal edema, claudication. Remaining systems are negative.  Physical Exam:   Blood pressure (!) 148/98, pulse 75, height 6\' 1"  (1.854 m), weight 238 lb 9.6 oz (108.2 kg), SpO2 99 %.  General:  Well developed/well nourished in NAD Skin warm/dry Patient not depressed No peripheral clubbing Back-normal HEENT-normal/normal eyelids Neck supple/normal carotid upstroke bilaterally; no bruits; no JVD; no thyromegaly chest - CTA/ normal expansion CV - RRR/normal S1 and S2; no murmurs, rubs or gallops;  PMI nondisplaced Abdomen -NT/ND, no HSM, no mass, + bowel sounds, positive bruit 2+ femoral pulses, no bruits Ext-no edema, chords, 2+ DP Neuro-grossly nonfocal  ECG -normal sinus rhythm at a rate of 75, right bundle branch block, nonspecific T wave changes.   First-degree AV block.  Personally reviewed  A/P  1 coronary artery disease-patient is noted to have coronary calcification.  Continue Plavix (he is on this for history of CVA); continue statin.  2 hyperlipidemia-given documented coronary disease we will increase Lipitor to 80 mg daily.  Check lipids and liver in 8 weeks.  3 hypertension-blood pressure elevated.  Add amlodipine 5 mg daily and follow.  4 previous CVA-continue Plavix.  5 dyspnea/chest pain-etiology unclear.  Symptoms are somewhat atypical.  I will arrange an echocardiogram to assess LV function.  We will also arrange a [place] nuclear study to screen for ischemia particularly in light of coronary calcification.  6 bruit-schedule abdominal ultrasound to exclude aneurysm.  Kirk Ruths, MD

## 2021-06-17 ENCOUNTER — Encounter: Payer: Self-pay | Admitting: Cardiology

## 2021-06-17 ENCOUNTER — Ambulatory Visit: Payer: Medicare Other | Admitting: Cardiology

## 2021-06-17 ENCOUNTER — Other Ambulatory Visit: Payer: Self-pay

## 2021-06-17 VITALS — BP 148/98 | HR 75 | Ht 73.0 in | Wt 238.6 lb

## 2021-06-17 DIAGNOSIS — E7800 Pure hypercholesterolemia, unspecified: Secondary | ICD-10-CM

## 2021-06-17 DIAGNOSIS — I10 Essential (primary) hypertension: Secondary | ICD-10-CM

## 2021-06-17 DIAGNOSIS — R0602 Shortness of breath: Secondary | ICD-10-CM

## 2021-06-17 DIAGNOSIS — R072 Precordial pain: Secondary | ICD-10-CM | POA: Diagnosis not present

## 2021-06-17 DIAGNOSIS — R0989 Other specified symptoms and signs involving the circulatory and respiratory systems: Secondary | ICD-10-CM | POA: Diagnosis not present

## 2021-06-17 DIAGNOSIS — I2510 Atherosclerotic heart disease of native coronary artery without angina pectoris: Secondary | ICD-10-CM | POA: Diagnosis not present

## 2021-06-17 MED ORDER — ATORVASTATIN CALCIUM 80 MG PO TABS: 80.0000 mg | ORAL_TABLET | Freq: Every day | ORAL | 3 refills | Status: AC

## 2021-06-17 MED ORDER — AMLODIPINE BESYLATE 5 MG PO TABS: 5.0000 mg | ORAL_TABLET | Freq: Every day | ORAL | 3 refills | Status: DC

## 2021-06-17 NOTE — Patient Instructions (Signed)
Medication Instructions:   START AMLODIPINE 5 MG ONCE DAILY  INCREASE ATORVASTATIN TO 80 MG ONCE DAILY= 1 WHOLE TABLET ONCE DAILY  *If you need a refill on your cardiac medications before your next appointment, please call your pharmacy*   Lab Work:  Your physician recommends that you return for lab work in: [place]  If you have labs (blood work) drawn today and your tests are completely normal, you will receive your results only by: [place] (if you have MyChart) OR A paper copy in the mail If you have any lab test that is abnormal or we need to change your treatment, we will call you to review the results.   Testing/Procedures:  Your physician has requested that you have a lexiscan myoview. For further information please visit [email or web address]. Please follow instruction sheet, as given. [place] has requested that you have an echocardiogram. Echocardiography is a painless test that uses sound waves to create images of your heart. It provides your doctor with information about the size and shape of your heart and how well your heart's chambers and valves are working. This procedure takes approximately one hour. There are no restrictions for this procedure. [place] has requested that you have an abdominal aorta duplex. During this test, an ultrasound is used to evaluate the aorta. Allow 30 minutes for this exam. Do not eat after midnight the day before and avoid carbonated beverages [facility]   Follow-Up: At [facility], you and your health needs are our priority.  As part of our continuing mission to provide you with exceptional heart care, we have created designated Provider Care Teams.  These Care Teams include your primary Cardiologist (physician) and Advanced Practice Providers (APPs -  Physician Assistants and Nurse Practitioners) who all work together to provide you with the care you need,  when you need it.  We recommend signing up for the patient portal called "MyChart".  Sign up information is provided on this After Visit Summary.  MyChart is used to connect with patients for Virtual Visits (Telemedicine).  Patients are able to view lab/test results, encounter notes, upcoming appointments, etc.  Non-urgent messages can be sent to your provider as well.   To learn more about what you can do with MyChart, go to [email or web address].    Your next appointment:   6 month(s)  The format for your next appointment:   In Person  Provider:   Kirk Ruths MD

## 2021-06-30 ENCOUNTER — Telehealth (HOSPITAL_COMMUNITY): Payer: Self-pay | Admitting: *Deleted

## 2021-06-30 NOTE — Telephone Encounter (Signed)
Left message on voicemail in reference to upcoming appointment scheduled for  07/05/21 Phone number given for a call back so details instructions can be given. Kirstie Peri

## 2021-07-02 ENCOUNTER — Telehealth (HOSPITAL_COMMUNITY): Payer: Self-pay | Admitting: *Deleted

## 2021-07-02 NOTE — Telephone Encounter (Signed)
Patient given detailed instructions per Myocardial Perfusion Study Information Sheet for the test on 07/07/21 at 0815. Patient notified to arrive 15 minutes early and that it is imperative to arrive on time for appointment to keep from having the test rescheduled.  If you need to cancel or reschedule your appointment, please call the office within 24 hours of your appointment. . Patient verbalized understanding.[name], [name]

## 2021-07-07 ENCOUNTER — Ambulatory Visit (HOSPITAL_BASED_OUTPATIENT_CLINIC_OR_DEPARTMENT_OTHER): Payer: Medicare Other

## 2021-07-07 ENCOUNTER — Other Ambulatory Visit: Payer: Self-pay

## 2021-07-07 ENCOUNTER — Ambulatory Visit (HOSPITAL_COMMUNITY): Payer: Medicare Other | Attending: Cardiovascular Disease

## 2021-07-07 DIAGNOSIS — R072 Precordial pain: Secondary | ICD-10-CM | POA: Diagnosis not present

## 2021-07-07 DIAGNOSIS — R0602 Shortness of breath: Secondary | ICD-10-CM | POA: Diagnosis not present

## 2021-07-07 LAB — MYOCARDIAL PERFUSION IMAGING
LV dias vol: 76 mL (ref 62–150)
LV sys vol: 40 mL
Nuc Stress EF: 47 %
Peak HR: 83 {beats}/min
Rest HR: 74 {beats}/min
Rest Nuclear Isotope Dose: 10.2 mCi
SDS: 1
SRS: 0
SSS: 1
ST Depression (mm): 0 mm
Stress Nuclear Isotope Dose: 31.3 mCi
TID: 1.08

## 2021-07-07 LAB — ECHOCARDIOGRAM COMPLETE
Area-P 1/2: 2.82 cm2
S' Lateral: 3.8 cm

## 2021-07-07 MED ORDER — TECHNETIUM TC 99M TETROFOSMIN IV KIT
10.2000 | PACK | Freq: Once | INTRAVENOUS | Status: AC | PRN
  Administered 2021-07-07: 10.2 via INTRAVENOUS
  Filled 2021-07-07: qty 11

## 2021-07-07 MED ORDER — TECHNETIUM TC 99M TETROFOSMIN IV KIT
31.3000 | PACK | Freq: Once | INTRAVENOUS | Status: AC | PRN
  Administered 2021-07-07: 31.3 via INTRAVENOUS
  Filled 2021-07-07: qty 32

## 2021-07-07 MED ORDER — REGADENOSON 0.4 MG/5ML IV SOLN
0.4000 mg | Freq: Once | INTRAVENOUS | Status: AC
  Administered 2021-07-07: 0.4 mg via INTRAVENOUS

## 2021-07-08 ENCOUNTER — Encounter: Payer: Self-pay | Admitting: *Deleted

## 2021-08-03 ENCOUNTER — Ambulatory Visit (HOSPITAL_COMMUNITY): Payer: Medicare Other

## 2021-08-11 ENCOUNTER — Ambulatory Visit (HOSPITAL_COMMUNITY)
Admission: RE | Admit: 2021-08-11 | Discharge: 2021-08-11 | Disposition: A | Discharge status: Home / Self Care | Payer: Medicare HMO | Source: Ambulatory Visit | Attending: Internal Medicine | Admitting: Internal Medicine

## 2021-08-11 ENCOUNTER — Other Ambulatory Visit: Payer: Self-pay

## 2021-08-11 DIAGNOSIS — Z87891 Personal history of nicotine dependence: Secondary | ICD-10-CM | POA: Diagnosis not present

## 2021-08-11 DIAGNOSIS — I10 Essential (primary) hypertension: Secondary | ICD-10-CM | POA: Diagnosis not present

## 2021-08-11 DIAGNOSIS — E785 Hyperlipidemia, unspecified: Secondary | ICD-10-CM | POA: Insufficient documentation

## 2021-08-11 DIAGNOSIS — Z136 Encounter for screening for cardiovascular disorders: Secondary | ICD-10-CM

## 2021-08-11 DIAGNOSIS — R0989 Other specified symptoms and signs involving the circulatory and respiratory systems: Secondary | ICD-10-CM | POA: Insufficient documentation

## 2021-08-11 DIAGNOSIS — Z8673 Personal history of transient ischemic attack (TIA), and cerebral infarction without residual deficits: Secondary | ICD-10-CM | POA: Diagnosis not present

## 2021-08-12 ENCOUNTER — Encounter: Payer: Self-pay | Admitting: *Deleted

## 2021-08-13 ENCOUNTER — Telehealth: Payer: Self-pay | Admitting: Cardiology

## 2021-08-13 NOTE — Telephone Encounter (Signed)
Pt has some questions about his ultrasound and would like to speak w/ Dr. Stanford Breed... please advise

## 2021-08-13 NOTE — Telephone Encounter (Signed)
Spoke with the patient. He stated that he would like to discuss his Aorta doppler results. He was offered an appointment with Dr. Stanford Breed to go over these results but stated that he did not need an appointment. He would prefer a phone call to help explain what this means. Attempted to explain the results to him but he stated that he would prefer to hear from Dr. Stanford Breed.    Abdominal Aorta: There is evidence of abnormal dilatation of the distal  Abdominal aorta. The largest aortic measurement is 4.0 cm. No previous  exam available for comparison.  IVC/Iliac: There is no evidence of thrombus involving the IVC.

## 2021-08-13 NOTE — Telephone Encounter (Signed)
Attempted to call patient, left message for patient to call back to office.   

## 2021-08-16 NOTE — Telephone Encounter (Signed)
Spoke with pt, questions regarding AAA answered.

## 2021-08-26 DIAGNOSIS — C44329 Squamous cell carcinoma of skin of other parts of face: Secondary | ICD-10-CM | POA: Diagnosis not present

## 2021-08-26 DIAGNOSIS — D224 Melanocytic nevi of scalp and neck: Secondary | ICD-10-CM | POA: Diagnosis not present

## 2021-08-26 DIAGNOSIS — L570 Actinic keratosis: Secondary | ICD-10-CM | POA: Diagnosis not present

## 2021-08-26 DIAGNOSIS — D485 Neoplasm of uncertain behavior of skin: Secondary | ICD-10-CM | POA: Diagnosis not present

## 2021-08-26 DIAGNOSIS — L821 Other seborrheic keratosis: Secondary | ICD-10-CM | POA: Diagnosis not present

## 2021-09-08 DIAGNOSIS — J849 Interstitial pulmonary disease, unspecified: Secondary | ICD-10-CM | POA: Diagnosis not present

## 2021-09-08 DIAGNOSIS — E7800 Pure hypercholesterolemia, unspecified: Secondary | ICD-10-CM | POA: Diagnosis not present

## 2021-09-08 DIAGNOSIS — Z1339 Encounter for screening examination for other mental health and behavioral disorders: Secondary | ICD-10-CM | POA: Diagnosis not present

## 2021-09-08 DIAGNOSIS — I700 Atherosclerosis of aorta: Secondary | ICD-10-CM | POA: Diagnosis not present

## 2021-09-08 DIAGNOSIS — I679 Cerebrovascular disease, unspecified: Secondary | ICD-10-CM | POA: Diagnosis not present

## 2021-09-08 DIAGNOSIS — R69 Illness, unspecified: Secondary | ICD-10-CM | POA: Diagnosis not present

## 2021-09-08 DIAGNOSIS — M19041 Primary osteoarthritis, right hand: Secondary | ICD-10-CM | POA: Diagnosis not present

## 2021-09-08 DIAGNOSIS — R82998 Other abnormal findings in urine: Secondary | ICD-10-CM | POA: Diagnosis not present

## 2021-09-08 DIAGNOSIS — Z1331 Encounter for screening for depression: Secondary | ICD-10-CM | POA: Diagnosis not present

## 2021-09-08 DIAGNOSIS — Z0000 Encounter for general adult medical examination without abnormal findings: Secondary | ICD-10-CM | POA: Diagnosis not present

## 2021-09-08 DIAGNOSIS — Z125 Encounter for screening for malignant neoplasm of prostate: Secondary | ICD-10-CM | POA: Diagnosis not present

## 2021-09-08 DIAGNOSIS — J309 Allergic rhinitis, unspecified: Secondary | ICD-10-CM | POA: Diagnosis not present

## 2021-09-08 DIAGNOSIS — I10 Essential (primary) hypertension: Secondary | ICD-10-CM | POA: Diagnosis not present

## 2021-09-08 DIAGNOSIS — D869 Sarcoidosis, unspecified: Secondary | ICD-10-CM | POA: Diagnosis not present

## 2021-11-29 DIAGNOSIS — M25561 Pain in right knee: Secondary | ICD-10-CM | POA: Diagnosis not present

## 2021-11-29 DIAGNOSIS — M170 Bilateral primary osteoarthritis of knee: Secondary | ICD-10-CM | POA: Diagnosis not present

## 2021-11-29 DIAGNOSIS — M25562 Pain in left knee: Secondary | ICD-10-CM | POA: Diagnosis not present

## 2022-01-13 DIAGNOSIS — D0439 Carcinoma in situ of skin of other parts of face: Secondary | ICD-10-CM | POA: Diagnosis not present

## 2022-01-13 DIAGNOSIS — L570 Actinic keratosis: Secondary | ICD-10-CM | POA: Diagnosis not present

## 2022-01-13 DIAGNOSIS — D485 Neoplasm of uncertain behavior of skin: Secondary | ICD-10-CM | POA: Diagnosis not present

## 2022-01-13 DIAGNOSIS — C44329 Squamous cell carcinoma of skin of other parts of face: Secondary | ICD-10-CM | POA: Diagnosis not present

## 2022-01-15 DIAGNOSIS — L03115 Cellulitis of right lower limb: Secondary | ICD-10-CM | POA: Diagnosis not present

## 2022-03-09 DIAGNOSIS — S51801A Unspecified open wound of right forearm, initial encounter: Secondary | ICD-10-CM | POA: Diagnosis not present

## 2022-04-11 DIAGNOSIS — R69 Illness, unspecified: Secondary | ICD-10-CM | POA: Diagnosis not present

## 2022-04-11 DIAGNOSIS — I10 Essential (primary) hypertension: Secondary | ICD-10-CM | POA: Diagnosis not present

## 2022-04-11 DIAGNOSIS — D692 Other nonthrombocytopenic purpura: Secondary | ICD-10-CM | POA: Diagnosis not present

## 2022-04-11 DIAGNOSIS — I700 Atherosclerosis of aorta: Secondary | ICD-10-CM | POA: Diagnosis not present

## 2022-04-11 DIAGNOSIS — G4700 Insomnia, unspecified: Secondary | ICD-10-CM | POA: Diagnosis not present

## 2022-04-11 DIAGNOSIS — D869 Sarcoidosis, unspecified: Secondary | ICD-10-CM | POA: Diagnosis not present

## 2022-04-11 DIAGNOSIS — E7800 Pure hypercholesterolemia, unspecified: Secondary | ICD-10-CM | POA: Diagnosis not present

## 2022-04-11 DIAGNOSIS — I679 Cerebrovascular disease, unspecified: Secondary | ICD-10-CM | POA: Diagnosis not present

## 2022-04-11 DIAGNOSIS — N401 Enlarged prostate with lower urinary tract symptoms: Secondary | ICD-10-CM | POA: Diagnosis not present

## 2022-04-11 DIAGNOSIS — J849 Interstitial pulmonary disease, unspecified: Secondary | ICD-10-CM | POA: Diagnosis not present

## 2022-04-11 DIAGNOSIS — H9319 Tinnitus, unspecified ear: Secondary | ICD-10-CM | POA: Diagnosis not present

## 2022-04-11 DIAGNOSIS — E669 Obesity, unspecified: Secondary | ICD-10-CM | POA: Diagnosis not present

## 2022-04-28 ENCOUNTER — Ambulatory Visit: Payer: Medicare HMO | Admitting: Pulmonary Disease

## 2022-04-28 ENCOUNTER — Ambulatory Visit (INDEPENDENT_AMBULATORY_CARE_PROVIDER_SITE_OTHER): Payer: Medicare HMO

## 2022-04-28 ENCOUNTER — Encounter: Payer: Self-pay | Admitting: Pulmonary Disease

## 2022-04-28 VITALS — BP 136/78 | HR 78 | Temp 98.0°F | Ht 74.0 in | Wt 227.6 lb

## 2022-04-28 DIAGNOSIS — Z23 Encounter for immunization: Secondary | ICD-10-CM

## 2022-04-28 DIAGNOSIS — D869 Sarcoidosis, unspecified: Secondary | ICD-10-CM

## 2022-04-28 MED ORDER — ALBUTEROL SULFATE HFA 108 (90 BASE) MCG/ACT IN AERS: 2.0000 | INHALATION_SPRAY | Freq: Four times a day (QID) | RESPIRATORY_TRACT | 6 refills | Status: AC | PRN

## 2022-04-28 MED ORDER — PREDNISONE 10 MG PO TABS: 10.0000 mg | ORAL_TABLET | Freq: Every day | ORAL | 0 refills | Status: AC

## 2022-04-28 NOTE — Patient Instructions (Signed)
X CXR today  X refill on albuterol MDI  X Prednisone 10 mg tabs  Take 2 tabs daily with food x 7ds, then 1 tab daily with food x 7ds then STOP - call me if this works  X flu shot

## 2022-04-28 NOTE — Progress Notes (Signed)
   Subjective:    Patient ID: [name]., male    DOB: November 27, 1945, 76 y.o.   MRN: [id number]  HPI  76 yo ex- smoker quit in 1988, initially seen for restrictive lung disease on PFTs, consolidation on CT with mediastinal calcified lymphadenopathy, biopsy showing granulomas, presumptive diagnosis of sarcoidosis  -on Prednisone from 04/2018 to July 2020  Chief Complaint  Patient presents with   Follow-up    Follow up. Patient states his breathing Is getting worse.    1 year FU visit -lives in [place] and comes down with various doctors appointments. He continues to complain of shortness of breath. Last visit we obtained CT chest which did not show any evidence of worsening sarcoidosis, stigmata of old disease and PFTs which showed her lung function was maintained.  He also underwent cardiology evaluation including echo and stress.  Echo showed grade 1 diastolic dysfunction He denies coughing, wheezing   Significant tests/ events reviewed  PFTs 05/2021 -moderate restriction, unchanged, ratio 80, FEV1 69%, FVC 63%, TLC 62%, DLCO 18.4/65% PFTs 01/2018 moderate intraparenchymal restriction, ratio 78, FEV1 69%, FVC 65%, TLC 60%, DLCO 48% but corrects to 85% for alveolar volume   Spirometry 07/2018-FVC 68%  CT chest w con 05/2021 >> unchanged - Widespread but patchy areas of ground-glass attenuation, septal thickening, cylindrical bronchiectasis,nodularity   CT chest 02/13/2019 - Stable chest CT compared to 07/10/2018. Multiple small calcified mediastinal and hilar lymph nodes consistent with sarcoidosis. Parenchymal nodularity with subpleural reticulation as well as perihilar bronchovascular thickening in the RIGHT lung is unchanged and consistent with pulmonary sarcoidosis. No progression. LEFT lung is relatively spared with mild nodularity in the upper lobe unchanged.   03/2018  HRCT >> calcified and noncalcified mediastinal and upper abdominal adenopathy with progression. Hilar  calcification and masslike soft tissue with near complete obstruction on the right middle lobe bronchus and associated right middle lobe volume loss.       04/2018 bronchoscopy in September 2019 that showed endobronchial lesion blocking the right middle lobe bronchus.  Biopsy positive for granuloma, pathology and cytology negative for malignant cells.     PET 05/2018 >> consistent with sarcoid with hypermetabolic mediastinal and hilar adenopathy and interstitial lung/alveolar lung disease and hypermetabolic abdominal lymph nodes   CT chest 07/2018 improved right lower lobe aeration    Review of Systems neg for any significant sore throat, dysphagia, itching, sneezing, nasal congestion or excess/ purulent secretions, fever, chills, sweats, unintended wt loss, pleuritic or exertional cp, hempoptysis, orthopnea pnd or change in chronic leg swelling. Also denies presyncope, palpitations, heartburn, abdominal pain, nausea, vomiting, diarrhea or change in bowel or urinary habits, dysuria,hematuria, rash, arthralgias, visual complaints, headache, numbness weakness or ataxia.     Objective:   Physical Exam  Gen. Pleasant, obese, in no distress ENT - no lesions, no post nasal drip Neck: No JVD, no thyromegaly, no carotid bruits Lungs: no use of accessory muscles, no dullness to percussion, decreased without rales or rhonchi  Cardiovascular: Rhythm regular, heart sounds  normal, no murmurs or gallops, no peripheral edema Musculoskeletal: No deformities, no cyanosis or clubbing , no tremors        Assessment & Plan:   Flu vaccination provided. I recommended RSV and COVID booster

## 2022-04-28 NOTE — Assessment & Plan Note (Signed)
Chest x-ray obtained today which again shows stigmata of old disease with scarring at the right base and calcified granulomas This is consistent with his PFTs and CT chest obtained last year I doubt that his sarcoidosis is active but he has unexplained shortness of breath without obvious postnasal drip or other trigger to explain. We will give him a short course of prednisone as a therapeutic trial-starting  20 mg of prednisone for 2 weeks.  If he improves can consider inhaled steroid/bronchodilator regimen We will provide him refills on albuterol

## 2022-04-29 ENCOUNTER — Telehealth: Payer: Self-pay

## 2022-04-29 NOTE — Telephone Encounter (Signed)
-----   Message from Rigoberto Noel, MD sent at 04/29/2022  2:27 PM EDT ----- Stable appearance from prior

## 2022-07-01 ENCOUNTER — Other Ambulatory Visit: Payer: Self-pay | Admitting: Cardiology

## 2022-07-01 DIAGNOSIS — I2510 Atherosclerotic heart disease of native coronary artery without angina pectoris: Secondary | ICD-10-CM

## 2022-07-18 ENCOUNTER — Other Ambulatory Visit: Payer: Self-pay | Admitting: *Deleted

## 2022-07-18 DIAGNOSIS — I7143 Infrarenal abdominal aortic aneurysm, without rupture: Secondary | ICD-10-CM

## 2022-08-11 ENCOUNTER — Ambulatory Visit (HOSPITAL_COMMUNITY): Admission: RE | Admit: 2022-08-11 | Payer: Medicare HMO | Source: Ambulatory Visit

## 2022-08-19 ENCOUNTER — Ambulatory Visit (HOSPITAL_COMMUNITY)
Admission: RE | Admit: 2022-08-19 | Discharge: 2022-08-19 | Disposition: A | Discharge status: Home / Self Care | Payer: Medicare HMO | Source: Ambulatory Visit | Attending: Cardiology | Admitting: Cardiology

## 2022-08-19 ENCOUNTER — Encounter: Payer: Self-pay | Admitting: *Deleted

## 2022-08-19 DIAGNOSIS — I7143 Infrarenal abdominal aortic aneurysm, without rupture: Secondary | ICD-10-CM | POA: Diagnosis not present

## 2022-08-29 DIAGNOSIS — M170 Bilateral primary osteoarthritis of knee: Secondary | ICD-10-CM | POA: Diagnosis not present

## 2022-09-14 DIAGNOSIS — J849 Interstitial pulmonary disease, unspecified: Secondary | ICD-10-CM | POA: Diagnosis not present

## 2022-09-14 DIAGNOSIS — Z1339 Encounter for screening examination for other mental health and behavioral disorders: Secondary | ICD-10-CM | POA: Diagnosis not present

## 2022-09-14 DIAGNOSIS — D692 Other nonthrombocytopenic purpura: Secondary | ICD-10-CM | POA: Diagnosis not present

## 2022-09-14 DIAGNOSIS — Z23 Encounter for immunization: Secondary | ICD-10-CM | POA: Diagnosis not present

## 2022-09-14 DIAGNOSIS — Z0000 Encounter for general adult medical examination without abnormal findings: Secondary | ICD-10-CM | POA: Diagnosis not present

## 2022-09-14 DIAGNOSIS — E7800 Pure hypercholesterolemia, unspecified: Secondary | ICD-10-CM | POA: Diagnosis not present

## 2022-09-14 DIAGNOSIS — Z1331 Encounter for screening for depression: Secondary | ICD-10-CM | POA: Diagnosis not present

## 2022-09-14 DIAGNOSIS — I700 Atherosclerosis of aorta: Secondary | ICD-10-CM | POA: Diagnosis not present

## 2022-09-14 DIAGNOSIS — R69 Illness, unspecified: Secondary | ICD-10-CM | POA: Diagnosis not present

## 2022-09-14 DIAGNOSIS — R82998 Other abnormal findings in urine: Secondary | ICD-10-CM | POA: Diagnosis not present

## 2022-09-14 DIAGNOSIS — Z125 Encounter for screening for malignant neoplasm of prostate: Secondary | ICD-10-CM | POA: Diagnosis not present

## 2022-09-14 DIAGNOSIS — K219 Gastro-esophageal reflux disease without esophagitis: Secondary | ICD-10-CM | POA: Diagnosis not present

## 2022-09-14 DIAGNOSIS — E669 Obesity, unspecified: Secondary | ICD-10-CM | POA: Diagnosis not present

## 2022-09-14 DIAGNOSIS — M19041 Primary osteoarthritis, right hand: Secondary | ICD-10-CM | POA: Diagnosis not present

## 2022-09-14 DIAGNOSIS — I679 Cerebrovascular disease, unspecified: Secondary | ICD-10-CM | POA: Diagnosis not present

## 2022-09-14 DIAGNOSIS — D869 Sarcoidosis, unspecified: Secondary | ICD-10-CM | POA: Diagnosis not present

## 2022-09-14 DIAGNOSIS — I10 Essential (primary) hypertension: Secondary | ICD-10-CM | POA: Diagnosis not present

## 2022-09-27 DIAGNOSIS — M170 Bilateral primary osteoarthritis of knee: Secondary | ICD-10-CM | POA: Diagnosis not present

## 2022-09-27 DIAGNOSIS — M1711 Unilateral primary osteoarthritis, right knee: Secondary | ICD-10-CM | POA: Diagnosis not present

## 2022-09-27 DIAGNOSIS — M1712 Unilateral primary osteoarthritis, left knee: Secondary | ICD-10-CM | POA: Diagnosis not present

## 2023-01-02 DIAGNOSIS — Z7189 Other specified counseling: Secondary | ICD-10-CM | POA: Diagnosis not present

## 2023-01-02 DIAGNOSIS — L218 Other seborrheic dermatitis: Secondary | ICD-10-CM | POA: Diagnosis not present

## 2023-01-02 DIAGNOSIS — L570 Actinic keratosis: Secondary | ICD-10-CM | POA: Diagnosis not present

## 2023-01-02 DIAGNOSIS — L821 Other seborrheic keratosis: Secondary | ICD-10-CM | POA: Diagnosis not present

## 2023-04-25 DIAGNOSIS — L570 Actinic keratosis: Secondary | ICD-10-CM | POA: Diagnosis not present

## 2023-04-25 DIAGNOSIS — L820 Inflamed seborrheic keratosis: Secondary | ICD-10-CM | POA: Diagnosis not present

## 2023-04-25 DIAGNOSIS — D485 Neoplasm of uncertain behavior of skin: Secondary | ICD-10-CM | POA: Diagnosis not present

## 2023-04-25 DIAGNOSIS — Z7189 Other specified counseling: Secondary | ICD-10-CM | POA: Diagnosis not present

## 2023-05-30 DIAGNOSIS — M546 Pain in thoracic spine: Secondary | ICD-10-CM | POA: Diagnosis not present

## 2023-05-30 DIAGNOSIS — R918 Other nonspecific abnormal finding of lung field: Secondary | ICD-10-CM | POA: Diagnosis not present

## 2023-05-30 DIAGNOSIS — I714 Abdominal aortic aneurysm, without rupture, unspecified: Secondary | ICD-10-CM | POA: Diagnosis not present

## 2023-05-30 DIAGNOSIS — R9431 Abnormal electrocardiogram [ECG] [EKG]: Secondary | ICD-10-CM | POA: Diagnosis not present

## 2023-05-30 DIAGNOSIS — Z885 Allergy status to narcotic agent status: Secondary | ICD-10-CM | POA: Diagnosis not present

## 2023-05-30 DIAGNOSIS — Z79899 Other long term (current) drug therapy: Secondary | ICD-10-CM | POA: Diagnosis not present

## 2023-05-30 DIAGNOSIS — R1013 Epigastric pain: Secondary | ICD-10-CM | POA: Diagnosis not present

## 2023-05-30 DIAGNOSIS — R110 Nausea: Secondary | ICD-10-CM | POA: Diagnosis not present

## 2023-05-30 DIAGNOSIS — Z7902 Long term (current) use of antithrombotics/antiplatelets: Secondary | ICD-10-CM | POA: Diagnosis not present

## 2023-05-30 DIAGNOSIS — K683 Retroperitoneal hematoma: Secondary | ICD-10-CM | POA: Diagnosis not present

## 2023-05-30 DIAGNOSIS — R072 Precordial pain: Secondary | ICD-10-CM | POA: Diagnosis not present

## 2023-05-30 DIAGNOSIS — R0789 Other chest pain: Secondary | ICD-10-CM | POA: Diagnosis not present

## 2023-05-30 DIAGNOSIS — I7143 Infrarenal abdominal aortic aneurysm, without rupture: Secondary | ICD-10-CM | POA: Diagnosis not present

## 2023-05-30 DIAGNOSIS — R079 Chest pain, unspecified: Secondary | ICD-10-CM | POA: Diagnosis not present

## 2023-06-02 DIAGNOSIS — Z23 Encounter for immunization: Secondary | ICD-10-CM | POA: Diagnosis not present

## 2023-06-15 DIAGNOSIS — J189 Pneumonia, unspecified organism: Secondary | ICD-10-CM | POA: Diagnosis not present

## 2023-06-15 DIAGNOSIS — R051 Acute cough: Secondary | ICD-10-CM | POA: Diagnosis not present

## 2023-06-15 DIAGNOSIS — D869 Sarcoidosis, unspecified: Secondary | ICD-10-CM | POA: Diagnosis not present

## 2023-06-15 DIAGNOSIS — I10 Essential (primary) hypertension: Secondary | ICD-10-CM | POA: Diagnosis not present

## 2023-06-15 DIAGNOSIS — R5383 Other fatigue: Secondary | ICD-10-CM | POA: Diagnosis not present

## 2023-06-15 DIAGNOSIS — J849 Interstitial pulmonary disease, unspecified: Secondary | ICD-10-CM | POA: Diagnosis not present

## 2023-06-15 DIAGNOSIS — R195 Other fecal abnormalities: Secondary | ICD-10-CM | POA: Diagnosis not present

## 2023-06-15 DIAGNOSIS — Z1152 Encounter for screening for COVID-19: Secondary | ICD-10-CM | POA: Diagnosis not present

## 2023-07-11 ENCOUNTER — Other Ambulatory Visit: Payer: Self-pay | Admitting: *Deleted

## 2023-07-11 DIAGNOSIS — I7143 Infrarenal abdominal aortic aneurysm, without rupture: Secondary | ICD-10-CM

## 2023-07-19 DIAGNOSIS — M25561 Pain in right knee: Secondary | ICD-10-CM | POA: Diagnosis not present

## 2023-07-19 DIAGNOSIS — M25562 Pain in left knee: Secondary | ICD-10-CM | POA: Diagnosis not present

## 2023-07-19 DIAGNOSIS — M170 Bilateral primary osteoarthritis of knee: Secondary | ICD-10-CM | POA: Diagnosis not present

## 2023-08-20 ENCOUNTER — Other Ambulatory Visit: Payer: Self-pay | Admitting: Cardiology

## 2023-08-20 DIAGNOSIS — I2510 Atherosclerotic heart disease of native coronary artery without angina pectoris: Secondary | ICD-10-CM

## 2023-08-28 ENCOUNTER — Other Ambulatory Visit: Payer: Self-pay | Admitting: Cardiology

## 2023-08-28 DIAGNOSIS — I2510 Atherosclerotic heart disease of native coronary artery without angina pectoris: Secondary | ICD-10-CM

## 2023-09-12 ENCOUNTER — Encounter: Payer: Self-pay | Admitting: *Deleted

## 2023-09-12 ENCOUNTER — Ambulatory Visit (HOSPITAL_COMMUNITY)
Admission: RE | Admit: 2023-09-12 | Discharge: 2023-09-12 | Disposition: A | Discharge status: Home / Self Care | Payer: Medicare Other | Source: Ambulatory Visit | Attending: Cardiology | Admitting: Cardiology

## 2023-09-12 ENCOUNTER — Other Ambulatory Visit: Payer: Self-pay | Admitting: *Deleted

## 2023-09-12 DIAGNOSIS — I7143 Infrarenal abdominal aortic aneurysm, without rupture: Secondary | ICD-10-CM

## 2023-09-22 DIAGNOSIS — E7800 Pure hypercholesterolemia, unspecified: Secondary | ICD-10-CM | POA: Diagnosis not present

## 2023-09-22 DIAGNOSIS — Z0000 Encounter for general adult medical examination without abnormal findings: Secondary | ICD-10-CM | POA: Diagnosis not present

## 2023-09-22 DIAGNOSIS — I10 Essential (primary) hypertension: Secondary | ICD-10-CM | POA: Diagnosis not present

## 2023-09-22 DIAGNOSIS — Z1339 Encounter for screening examination for other mental health and behavioral disorders: Secondary | ICD-10-CM | POA: Diagnosis not present

## 2023-09-22 DIAGNOSIS — Z1331 Encounter for screening for depression: Secondary | ICD-10-CM | POA: Diagnosis not present

## 2023-09-25 ENCOUNTER — Other Ambulatory Visit: Payer: Self-pay | Admitting: Cardiology

## 2023-09-25 DIAGNOSIS — I2510 Atherosclerotic heart disease of native coronary artery without angina pectoris: Secondary | ICD-10-CM

## 2023-09-25 DIAGNOSIS — Z1212 Encounter for screening for malignant neoplasm of rectum: Secondary | ICD-10-CM | POA: Diagnosis not present

## 2023-09-25 DIAGNOSIS — Z125 Encounter for screening for malignant neoplasm of prostate: Secondary | ICD-10-CM | POA: Diagnosis not present

## 2023-09-25 DIAGNOSIS — Z1389 Encounter for screening for other disorder: Secondary | ICD-10-CM | POA: Diagnosis not present

## 2023-09-28 DIAGNOSIS — M170 Bilateral primary osteoarthritis of knee: Secondary | ICD-10-CM | POA: Diagnosis not present

## 2023-10-23 DIAGNOSIS — Z7189 Other specified counseling: Secondary | ICD-10-CM | POA: Diagnosis not present

## 2023-10-23 DIAGNOSIS — L570 Actinic keratosis: Secondary | ICD-10-CM | POA: Diagnosis not present

## 2023-10-23 DIAGNOSIS — D485 Neoplasm of uncertain behavior of skin: Secondary | ICD-10-CM | POA: Diagnosis not present

## 2023-10-23 DIAGNOSIS — L218 Other seborrheic dermatitis: Secondary | ICD-10-CM | POA: Diagnosis not present

## 2023-10-23 DIAGNOSIS — L821 Other seborrheic keratosis: Secondary | ICD-10-CM | POA: Diagnosis not present

## 2023-10-30 DIAGNOSIS — K080 Exfoliation of teeth due to systemic causes: Secondary | ICD-10-CM | POA: Diagnosis not present

## 2023-11-17 ENCOUNTER — Other Ambulatory Visit: Payer: Self-pay | Admitting: Cardiology

## 2023-11-17 DIAGNOSIS — I2510 Atherosclerotic heart disease of native coronary artery without angina pectoris: Secondary | ICD-10-CM

## 2023-12-11 ENCOUNTER — Telehealth: Payer: Self-pay | Admitting: Cardiology

## 2023-12-11 DIAGNOSIS — I2510 Atherosclerotic heart disease of native coronary artery without angina pectoris: Secondary | ICD-10-CM

## 2023-12-11 MED ORDER — AMLODIPINE BESYLATE 5 MG PO TABS: 5.0000 mg | ORAL_TABLET | Freq: Every day | ORAL | 1 refills | Status: DC

## 2023-12-11 NOTE — Telephone Encounter (Signed)
 Pt's medication was sent to pt's pharmacy as requested. Confirmation received.

## 2023-12-11 NOTE — Telephone Encounter (Signed)
*  STAT* If patient is at the pharmacy, call can be transferred to refill team.   1. Which medications need to be refilled? (please list name of each medication and dose if known)   amLODipine  (NORVASC ) 5 MG tablet    2. Which pharmacy/location (including street and city if local pharmacy) is medication to be sent to? [id number] - [place], [place] - [address]      3. Do they need a 30 day or 90 day supply? 90 day   Pt is out of medication and has office visit scheduled for July but also on wait list.

## 2023-12-18 MED ORDER — AMLODIPINE BESYLATE 5 MG PO TABS: 5.0000 mg | ORAL_TABLET | Freq: Every day | ORAL | 1 refills | Status: DC

## 2023-12-18 NOTE — Addendum Note (Signed)
 Addended by: Gayleen Kawasaki D on: 12/18/2023 09:25 AM   Modules accepted: Orders

## 2024-01-03 ENCOUNTER — Telehealth: Payer: Self-pay | Admitting: Cardiology

## 2024-01-03 DIAGNOSIS — I2510 Atherosclerotic heart disease of native coronary artery without angina pectoris: Secondary | ICD-10-CM

## 2024-01-03 MED ORDER — AMLODIPINE BESYLATE 5 MG PO TABS: 5.0000 mg | ORAL_TABLET | Freq: Every day | ORAL | 1 refills | Status: DC

## 2024-01-03 NOTE — Telephone Encounter (Signed)
 RX sent to requested Pharmacy

## 2024-01-03 NOTE — Telephone Encounter (Signed)
*  STAT* If patient is at the pharmacy, call can be transferred to refill team.   1. Which medications need to be refilled? (please list name of each medication and dose if known)   amLODipine  (NORVASC ) 5 MG tablet   2. Which pharmacy/location (including street and city if local pharmacy) is medication to be sent to? [id number] - [place], [place] - [address] [facility] [address] Phone: [phone]  Fax: [phone]     3. Do they need a 30 day or 90 day supply? Pt has appt on  02/06/24 please refill til then

## 2024-01-03 NOTE — Telephone Encounter (Signed)
Pt has requested too soon

## 2024-01-30 NOTE — Progress Notes (Signed)
 HPI: FU coronary artery disease. Carotid Dopplers September 2016 showed 1 to 39% left stenosis. Transesophageal echocardiogram October 2016 showed normal LV function, trace aortic insufficiency, moderate plaque in the descending aorta, mild mitral regurgitation, dilated left atrial appendage and negative saline microcavitation study.  Patient had implantable loop monitor placed secondary to CVA but no arrhythmias identified. Chest CT October 2022 showed stigmata consistent with sarcoidosis.  There was also note of aortic atherosclerosis and calcification in the left main and LAD arteries.  Echocardiogram December 2022 showed normal LV function, grade 1 diastolic dysfunction, mild right ventricular enlargement and mildly dilated aortic root at 39 mm.  Nuclear study December 2022 showed ejection fraction 47% and no ischemia or infarction.  Abdominal ultrasound February 2025 showed 4.3 cm abdominal aortic aneurysm.  Since last seen patient has dyspnea on exertion unchanged.  No orthopnea, PND, pedal edema, chest pain or syncope.  Current Outpatient Medications  Medication Sig Dispense Refill   albuterol  (VENTOLIN  HFA) 108 (90 Base) MCG/ACT inhaler Inhale 2 puffs into the lungs every 6 (six) hours as needed for wheezing or shortness of breath. 8 g 6   amLODipine  (NORVASC ) 5 MG tablet Take 1 tablet (5 mg total) by mouth daily. 30 tablet 1   atorvastatin  (LIPITOR ) 80 MG tablet Take 1 tablet (80 mg total) by mouth at bedtime. 90 tablet 3   clopidogrel  (PLAVIX ) 75 MG tablet Take 75 mg by mouth daily.     clotrimazole -betamethasone  (LOTRISONE ) cream Apply 1 application topically 2 (two) times daily. 30 g 0   famotidine  (PEPCID ) 20 MG tablet Take 20 mg by mouth daily.     ketoconazole (NIZORAL) 2 % shampoo APPLY TO SCALP DAILY  2   losartan (COZAAR) 100 MG tablet Take 100 mg by mouth daily.  2   pantoprazole (PROTONIX) 40 MG tablet Take 40 mg by mouth daily.      predniSONE  (DELTASONE ) 10 MG tablet Take 1  tablet (10 mg total) by mouth daily with breakfast. 21 tablet 0   tamsulosin (FLOMAX) 0.4 MG CAPS capsule Take 0.4 mg by mouth at bedtime.      cetirizine (ZYRTEC) 10 MG tablet Take 10 mg by mouth daily. (Patient not taking: Reported on 02/06/2024)     No current facility-administered medications for this visit.     Past Medical History:  Diagnosis Date   Arthritis    KNEES    Cerebrovascular accident involving posterior circulation (HCC) 04/02/2015   Essential hypertension    GERD 05/19/2008   Qualifier: Diagnosis of  By: Abran MD, Norleen SAILOR    History of right bundle branch block (RBBB)    HLD (hyperlipidemia)    INGUINAL HERNIA, LEFT 05/29/2008   Qualifier: Diagnosis of  By: Abran MD, Norleen SAILOR    Neuropathy    Sarcoid    Skin cancer    hx of   Syncope and collapse     Past Surgical History:  Procedure Laterality Date   CATARACT EXTRACTION     COLONOSCOPY     EP IMPLANTABLE DEVICE N/A 05/04/2015   Procedure: Loop Recorder Insertion;  Surgeon: Will Gladis Norton, MD;  Location: [facility];  Service: Cardiovascular;  Laterality: N/A;   HERNIA REPAIR     KNEE SURGERY Right 12/31/2014   LOOP RECORDER REMOVAL N/A 02/28/2018   Procedure: LOOP RECORDER REMOVAL;  Surgeon: Norton Soyla Gladis, MD;  Location: [facility];  Service: Cardiovascular;  Laterality: N/A;   SKIN CANCER EXCISION  12/31/2014   TEE WITHOUT CARDIOVERSION N/A 05/04/2015   Procedure: TRANSESOPHAGEAL ECHOCARDIOGRAM (TEE);  Surgeon: Leim VEAR Moose, MD;  Location: [facility] ENDOSCOPY;  Service: Cardiovascular;  Laterality: N/A;   VIDEO BRONCHOSCOPY Bilateral 04/23/2018   Procedure: VIDEO BRONCHOSCOPY WITH FLUORO;  Surgeon: Jude Harden GAILS, MD;  Location: [facility] ENDOSCOPY;  Service: Cardiopulmonary;  Laterality: Bilateral;    Social History   Socioeconomic History   Marital status: Married    Spouse name: Not on file   Number of children: 4   Years of education: Not on file   Highest education level: Not  on file  Occupational History   Not on file  Tobacco Use   Smoking status: Former    Current packs/day: 0.00    Average packs/day: 4.0 packs/day for 24.0 years (96.0 ttl pk-yrs)    Types: Cigarettes    Start date: 08/01/1962    Quit date: 08/01/1986    Years since quitting: 37.5   Smokeless tobacco: Never  Substance and Sexual Activity   Alcohol  use: Yes    Alcohol /week: 3.0 standard drinks of alcohol     Types: 1 Glasses of wine, 1 Cans of beer, 1 Shots of liquor per week    Comment: occasional   Drug use: No   Sexual activity: Not on file  Other Topics Concern   Not on file  Social History Narrative   Not on file   Social Drivers of Health   Financial Resource Strain: Not on file  Food Insecurity: Not on file  Transportation Needs: Not on file  Physical Activity: Not on file  Stress: Not on file  Social Connections: Unknown (05/30/2023)   Received from [facility]   Social Network    Social Network: Not on file  Intimate Partner Violence: Not At Risk (05/30/2023)   Received from [facility]   HITS    Over the last 12 months how often did your partner physically hurt you?: Never    Over the last 12 months how often did your partner insult you or talk down to you?: Never    Over the last 12 months how often did your partner threaten you with physical harm?: Never    Over the last 12 months how often did your partner scream or curse at you?: Never    Family History  Problem Relation Age of Onset   Diabetes Mother    Kidney disease Mother    Emphysema Father    Prostate cancer Maternal Uncle    CAD Neg Hx        NEGATIVE HX OF PREMATURE CAD    ROS: no fevers or chills, productive cough, hemoptysis, dysphasia, odynophagia, melena, hematochezia, dysuria, hematuria, rash, seizure activity, orthopnea, PND, pedal edema, claudication. Remaining systems are negative.  Physical Exam: Well-developed well-nourished in no acute distress.  Skin is warm and dry.  HEENT is  normal.  Neck is supple.  Chest is clear to auscultation with normal expansion.  Cardiovascular exam is regular rate and rhythm.  Abdominal exam nontender or distended. No masses palpated. Extremities show no edema. neuro grossly intact  EKG Interpretation Date/Time:  Tuesday February 06 2024 11:11:08 EDT Ventricular Rate:  70 PR Interval:  180 QRS Duration:  138 QT Interval:  418 QTC Calculation: 451 R Axis:   23  Text Interpretation: Normal sinus rhythm Right bundle branch block Confirmed by Pietro Rogue ([id number]) on 02/06/2024 11:13:57 AM    A/P  1 coronary artery disease-based on previous CT showing coronary calcification.  Patient denies chest pain.  Follow-up nuclear study showed no ischemia.  Plan to continue medical therapy with Plavix  and statin.  2 history of CVA-he will continue Plavix .  3 hyperlipidemia-continue statin.  4 hypertension-blood pressure controlled.  Continue present medical regimen.  5 abdominal aortic aneurysm-patient will need follow-up ultrasound February 2026.  6 history of dyspnea-felt likely secondary to pulmonary sarcoid.  Redell Shallow, MD

## 2024-02-06 ENCOUNTER — Ambulatory Visit: Attending: Cardiology | Admitting: Cardiology

## 2024-02-06 ENCOUNTER — Encounter: Payer: Self-pay | Admitting: Cardiology

## 2024-02-06 VITALS — BP 130/70 | HR 70 | Ht 73.0 in | Wt 224.0 lb

## 2024-02-06 DIAGNOSIS — I10 Essential (primary) hypertension: Secondary | ICD-10-CM | POA: Diagnosis not present

## 2024-02-06 DIAGNOSIS — I2510 Atherosclerotic heart disease of native coronary artery without angina pectoris: Secondary | ICD-10-CM

## 2024-02-06 DIAGNOSIS — E7800 Pure hypercholesterolemia, unspecified: Secondary | ICD-10-CM

## 2024-02-06 DIAGNOSIS — I7143 Infrarenal abdominal aortic aneurysm, without rupture: Secondary | ICD-10-CM | POA: Diagnosis not present

## 2024-02-06 NOTE — Patient Instructions (Signed)

## 2024-02-10 DIAGNOSIS — H1012 Acute atopic conjunctivitis, left eye: Secondary | ICD-10-CM | POA: Diagnosis not present

## 2024-02-21 DIAGNOSIS — H02886 Meibomian gland dysfunction of left eye, unspecified eyelid: Secondary | ICD-10-CM | POA: Diagnosis not present

## 2024-02-21 DIAGNOSIS — H02883 Meibomian gland dysfunction of right eye, unspecified eyelid: Secondary | ICD-10-CM | POA: Diagnosis not present

## 2024-02-21 DIAGNOSIS — H0015 Chalazion left lower eyelid: Secondary | ICD-10-CM | POA: Diagnosis not present

## 2024-03-28 DIAGNOSIS — Z23 Encounter for immunization: Secondary | ICD-10-CM | POA: Diagnosis not present

## 2024-03-28 DIAGNOSIS — I10 Essential (primary) hypertension: Secondary | ICD-10-CM | POA: Diagnosis not present

## 2024-04-04 DIAGNOSIS — M25561 Pain in right knee: Secondary | ICD-10-CM | POA: Diagnosis not present

## 2024-04-04 DIAGNOSIS — M25562 Pain in left knee: Secondary | ICD-10-CM | POA: Diagnosis not present

## 2024-04-04 DIAGNOSIS — M170 Bilateral primary osteoarthritis of knee: Secondary | ICD-10-CM | POA: Diagnosis not present

## 2024-05-06 ENCOUNTER — Other Ambulatory Visit: Payer: Self-pay | Admitting: Cardiology

## 2024-05-06 DIAGNOSIS — I2510 Atherosclerotic heart disease of native coronary artery without angina pectoris: Secondary | ICD-10-CM

## 2024-05-14 DIAGNOSIS — K080 Exfoliation of teeth due to systemic causes: Secondary | ICD-10-CM | POA: Diagnosis not present

## 2024-05-15 DIAGNOSIS — L821 Other seborrheic keratosis: Secondary | ICD-10-CM | POA: Diagnosis not present

## 2024-05-15 DIAGNOSIS — Z7189 Other specified counseling: Secondary | ICD-10-CM | POA: Diagnosis not present

## 2024-05-15 DIAGNOSIS — L570 Actinic keratosis: Secondary | ICD-10-CM | POA: Diagnosis not present

## 2024-05-15 DIAGNOSIS — D1801 Hemangioma of skin and subcutaneous tissue: Secondary | ICD-10-CM | POA: Diagnosis not present

## 2024-05-30 ENCOUNTER — Encounter (HOSPITAL_BASED_OUTPATIENT_CLINIC_OR_DEPARTMENT_OTHER): Payer: Self-pay

## 2024-07-03 DIAGNOSIS — L570 Actinic keratosis: Secondary | ICD-10-CM | POA: Diagnosis not present

## 2024-08-26 ENCOUNTER — Ambulatory Visit (HOSPITAL_COMMUNITY)

## 2024-09-11 ENCOUNTER — Ambulatory Visit (HOSPITAL_COMMUNITY)

## 2024-09-13 ENCOUNTER — Ambulatory Visit (HOSPITAL_BASED_OUTPATIENT_CLINIC_OR_DEPARTMENT_OTHER): Admitting: Pulmonary Disease
